# Patient Record
Sex: Male | Born: 1985 | Race: White | Hispanic: No | Marital: Married | Smoking: Former smoker
Health system: Southern US, Community
[De-identification: ages and names within clinical notes are randomized; demographics above are authoritative.]

## PROBLEM LIST (undated history)

## (undated) DIAGNOSIS — R319 Hematuria, unspecified: Secondary | ICD-10-CM

## (undated) HISTORY — PX: TONSILLECTOMY: SUR1361

## (undated) HISTORY — PX: WISDOM TOOTH EXTRACTION: SHX21

## (undated) HISTORY — DX: Hematuria, unspecified: R31.9

---

## 2008-08-02 ENCOUNTER — Ambulatory Visit: Payer: Self-pay | Admitting: Occupational Medicine

## 2011-06-22 ENCOUNTER — Encounter: Payer: Self-pay | Admitting: Emergency Medicine

## 2011-06-22 ENCOUNTER — Emergency Department (INDEPENDENT_AMBULATORY_CARE_PROVIDER_SITE_OTHER)
Admission: EM | Admit: 2011-06-22 | Discharge: 2011-06-22 | Disposition: A | Discharge status: Home / Self Care | Payer: BC Managed Care – PPO | Source: Home / Self Care | Attending: Family Medicine | Admitting: Family Medicine

## 2011-06-22 ENCOUNTER — Other Ambulatory Visit: Payer: Self-pay | Admitting: Family Medicine

## 2011-06-22 DIAGNOSIS — R300 Dysuria: Secondary | ICD-10-CM

## 2011-06-22 DIAGNOSIS — R319 Hematuria, unspecified: Secondary | ICD-10-CM

## 2011-06-22 LAB — POCT URINALYSIS DIPSTICK
Bilirubin, UA: NEGATIVE
Glucose, UA: NEGATIVE
Spec Grav, UA: 1.015 (ref 1.005–1.03)
Urobilinogen, UA: 0.2 (ref 0–1)

## 2011-06-22 MED ORDER — DOXYCYCLINE HYCLATE 100 MG PO TABS: 100.0000 mg | ORAL_TABLET | Freq: Two times a day (BID) | ORAL | Status: AC

## 2011-06-22 NOTE — ED Notes (Signed)
Dysuria x 24 hours.

## 2011-06-24 NOTE — ED Provider Notes (Signed)
History     CSN: [id number] Arrival date & time: 06/22/2011  1:40 PM   First MD Initiated Contact with Patient 06/22/11 1420      Chief Complaint  Patient presents with  . Dysuria     HPI Comments: Patient complains of mild dysuria, frequency, and hesitancy for one day.  He has also noticed intermittent blood in urine.  No urethra discharge.  No testicular pain or swelling.  He feels well otherwise.  Wife is assymptomatic at present.  Patient is a 25 y.o. male presenting with dysuria. The history is provided by the patient.  Dysuria  This is a new problem. The current episode started yesterday. The problem occurs every urination. The problem has not changed since onset.The quality of the pain is described as burning. The pain is mild. There has been no fever. He is sexually active. Associated symptoms include hematuria. He has tried nothing for the symptoms.    History reviewed. No pertinent past medical history.  Past Surgical History  Procedure Date  . Tonsillectomy     History reviewed. No pertinent family history.  History  Substance Use Topics  . Smoking status: Never Smoker   . Smokeless tobacco: Not on file  . Alcohol Use: Yes      Review of Systems  Genitourinary: Positive for dysuria and hematuria.  All other systems reviewed and are negative.    Allergies  Review of patient's allergies indicates no known allergies.  Home Medications   Current Outpatient Rx  Name Route Sig Dispense Refill  . DOXYCYCLINE HYCLATE 100 MG PO TABS Oral Take 1 tablet (100 mg total) by mouth 2 (two) times daily. 14 tablet 0    BP 133/86  Pulse 76  Temp(Src) 98.4 F (36.9 C) (Oral)  Resp 16  Ht 5\' 10"  (1.778 m)  Wt 230 lb (104.327 kg)  BMI 33.00 kg/m2  SpO2 99%  Physical Exam  Nursing note and vitals reviewed. Constitutional: He is oriented to person, place, and time. He appears well-developed and well-nourished. No distress.       Patient is obese (BMI 33.1)     HENT:  Head: Normocephalic.  Mouth/Throat: Oropharynx is clear and moist.  Eyes: Pupils are equal, round, and reactive to light.  Neck: Neck supple.  Cardiovascular: Regular rhythm and normal heart sounds.   Pulmonary/Chest: Effort normal and breath sounds normal.  Abdominal: Soft. There is no tenderness.  Genitourinary: Testes normal and penis normal. Right testis shows no swelling and no tenderness. Left testis shows no swelling and no tenderness. No penile erythema or penile tenderness. No discharge found.  Lymphadenopathy:    He has no cervical adenopathy.       Right: No inguinal adenopathy present.       Left: No inguinal adenopathy present.  Neurological: He is alert and oriented to person, place, and time.  Skin: Skin is warm and dry. No rash noted.  Psychiatric: He has a normal mood and affect.    ED Course  Procedures  none   Labs Reviewed  POCT URINALYSIS DIPSTICK trace blood, otherwise negative  URINE CULTURE pending      1. Dysuria   2. Hematuria       MDM   Urine culture, and GC/chlamydia pending. Empirically begin doxycycline for one week.  Increase fluid intake. Followup with urologist if not improving.        Donna Christen, MD 06/24/11 [phone]

## 2011-06-25 ENCOUNTER — Telehealth: Payer: Self-pay | Admitting: *Deleted

## 2011-06-26 LAB — URINE CULTURE
Colony Count: NO GROWTH
Organism ID, Bacteria: NO GROWTH

## 2011-06-27 LAB — GC/CHLAMYDIA PROBE AMP, URINE
Chlamydia, Swab/Urine, PCR: NEGATIVE
Chlamydia, Swab/Urine, PCR: NEGATIVE

## 2011-06-27 NOTE — ED Provider Notes (Signed)
Contacted patient and notified of GC/chlamydia and urine culture results.  He states that hematuria has resolved.  Still has some very mild intermittent dysuria.   Recommend follow-up by urologist if symptoms persist.  Donna Christen, MD 06/27/11 1600

## 2011-06-28 ENCOUNTER — Telehealth: Payer: Self-pay | Admitting: Emergency Medicine

## 2013-04-30 ENCOUNTER — Ambulatory Visit (INDEPENDENT_AMBULATORY_CARE_PROVIDER_SITE_OTHER): Payer: BC Managed Care – PPO | Admitting: Family Medicine

## 2013-04-30 ENCOUNTER — Ambulatory Visit (INDEPENDENT_AMBULATORY_CARE_PROVIDER_SITE_OTHER): Payer: Managed Care, Other (non HMO)

## 2013-04-30 ENCOUNTER — Encounter: Payer: Self-pay | Admitting: Family Medicine

## 2013-04-30 VITALS — BP 137/91 | HR 77 | Ht 71.0 in | Wt 237.0 lb

## 2013-04-30 DIAGNOSIS — N5089 Other specified disorders of the male genital organs: Secondary | ICD-10-CM

## 2013-04-30 DIAGNOSIS — N508 Other specified disorders of male genital organs: Secondary | ICD-10-CM

## 2013-04-30 LAB — POCT URINALYSIS DIPSTICK
Bilirubin, UA: NEGATIVE
Blood, UA: NEGATIVE
Glucose, UA: NEGATIVE
Spec Grav, UA: 1.025
Urobilinogen, UA: 1
pH, UA: 7

## 2013-04-30 NOTE — Progress Notes (Signed)
CC: Paul Bowen is a 27 y.o. male is here for Establish Care and lump on rt testicle   Subjective: HPI:  Very pleasant 27 year old here to establish care  Patient claims a right testicular mass that he noticed approximately 2-3 weeks ago which is mildly painful to touch. Does not seem to be growing or changing since onset. He's never had this before. No family history of testicular or any other cancer that he knows of. He denies any recent dysuria, testicular swelling, hematuria, discharge, penile pain, unintentional weight loss, nor joint or bone pain. No interventions as of yet. Denies recent trauma to the genitals.  Review of Systems - General ROS: negative for - chills, fever, night sweats, weight gain or weight loss Ophthalmic ROS: negative for - decreased vision Psychological ROS: negative for - anxiety or depression ENT ROS: negative for - hearing change, nasal congestion, tinnitus or allergies Hematological and Lymphatic ROS: negative for - bleeding problems, bruising or swollen lymph nodes Breast ROS: negative Respiratory ROS: no cough, shortness of breath, or wheezing Cardiovascular ROS: no chest pain or dyspnea on exertion Gastrointestinal ROS: no abdominal pain, change in bowel habits, or black or bloody stools Genito-Urinary ROS: negative for - genital discharge, genital ulcers, incontinence or abnormal bleeding from genitals Musculoskeletal ROS: negative for - joint pain or muscle pain Neurological ROS: negative for - headaches or memory loss Dermatological ROS: negative for lumps, mole changes, rash and skin lesion changes  Past Medical History  Diagnosis Date  . Hematuria      Family History  Problem Relation Age of Onset  . Hypertension Mother      History  Substance Use Topics  . Smoking status: Former Smoker    Quit date: 05/01/2007  . Smokeless tobacco: Not on file  . Alcohol Use: Yes     Objective: Filed Vitals:   04/30/13 0822  BP: 137/91  Pulse: 77     General: Alert and Oriented, No Acute Distress HEENT: Pupils equal, round, reactive to light. Conjunctivae clear.  Moist mucous membranes Lungs: Clear to auscultation bilaterally, no wheezing/ronchi/rales.  Comfortable work of breathing. Good air movement. Cardiac: Regular rate and rhythm. Normal S1/S2.  No murmurs, rubs, nor gallops.   Extremities: No peripheral edema.  Strong peripheral pulses.  Genitourinary: Grossly normal-appearing penis without lesions, left testicular unremarkable, right testicle with approximately half centimeter nodule near the epididymis slightly tender to the touch gross to the scrotum does not look swollen or discolored. Both testicles are not high riding or horizontal. Mental Status: No depression, anxiety, nor agitation. Skin: Warm and dry.  Assessment & Plan: [name] was seen today for establish care and lump on rt testicle.  Diagnoses and associated orders for this visit:  Testicular mass - US Scrotum; Future - Urinalysis Dipstick    Testicular mass: Scheduling patient for ultrasound of the scrotum this afternoon urinalysis was obtained due to history of hematuria 2 years ago fortunately unremarkable today. Followup will be determined based on above results.  Return if symptoms worsen or fail to improve.

## 2013-05-03 ENCOUNTER — Encounter: Payer: Self-pay | Admitting: Family Medicine

## 2013-05-03 DIAGNOSIS — N503 Cyst of epididymis: Secondary | ICD-10-CM

## 2013-05-03 HISTORY — DX: Cyst of epididymis: N50.3

## 2014-10-07 IMAGING — US US SCROTUM
1 series · 14 of 25 positions shown · non-contrast
Comparison: None.

CLINICAL DATA: Right testicular mass.

EXAM:
ULTRASOUND OF SCROTUM
TECHNIQUE: Complete ultrasound examination of the testicles, epididymis, and
other scrotal structures was performed.

[Series 1: us scrotum · 0.08mm/px · 14 of 54 slices shown]
[im 1/54]
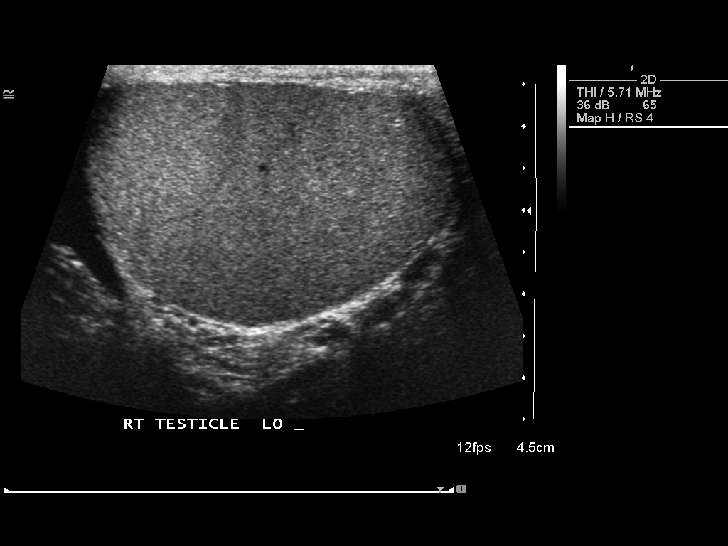
[im 5/54]
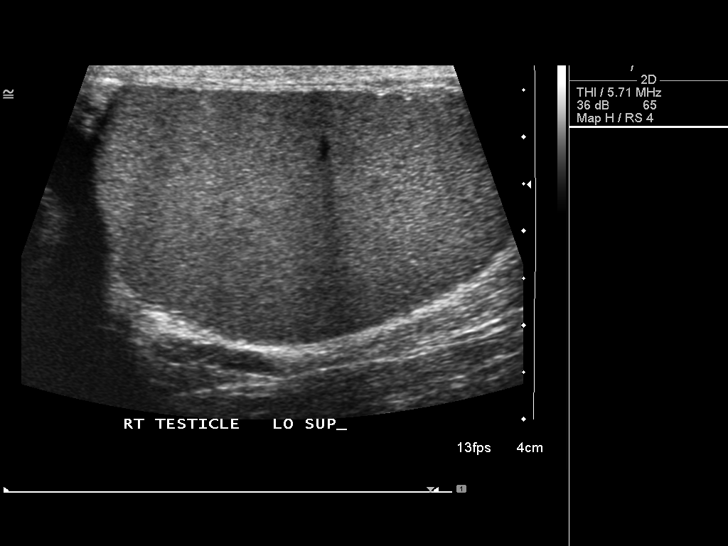
[im 9/54]
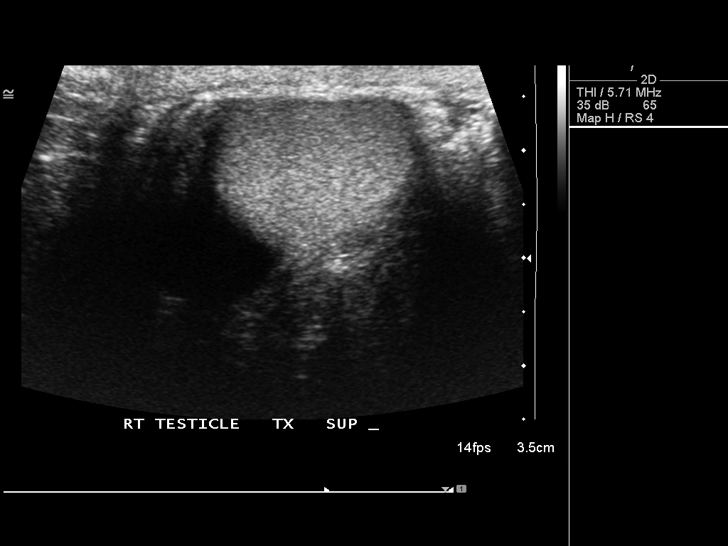
[im 14/54]
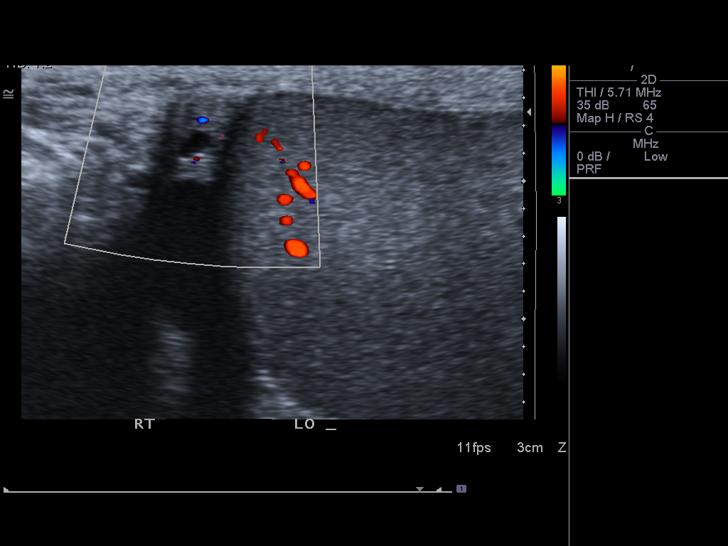
[im 18/54]
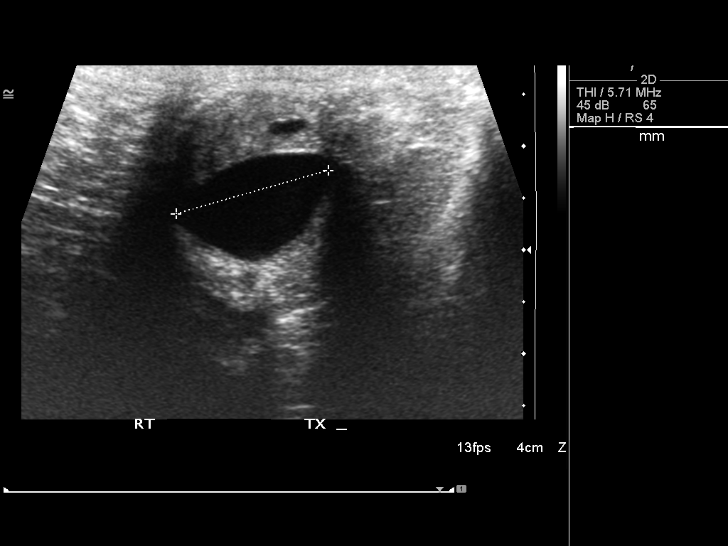
[im 20/54]
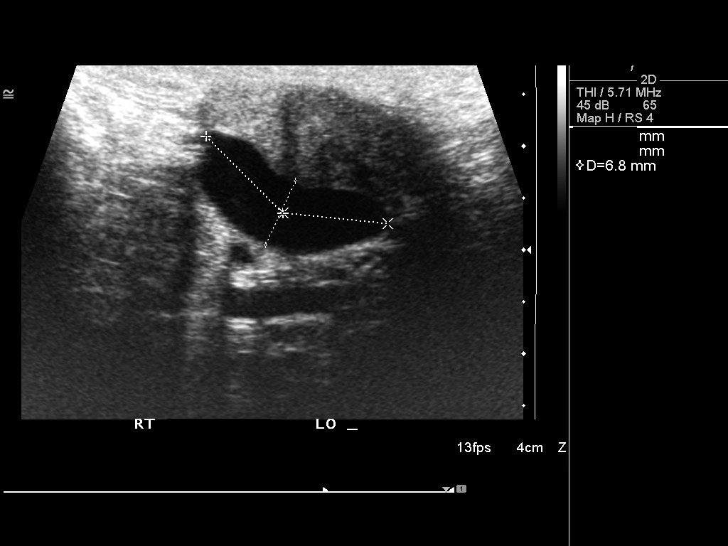
[im 25/54]
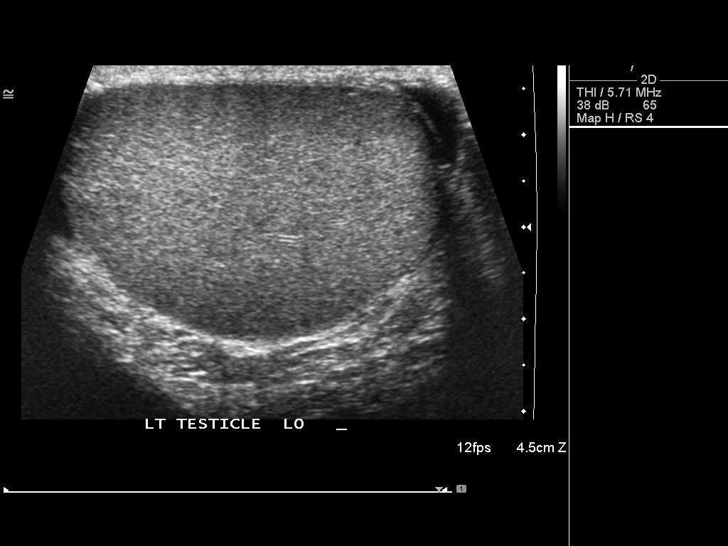
[im 29/54]
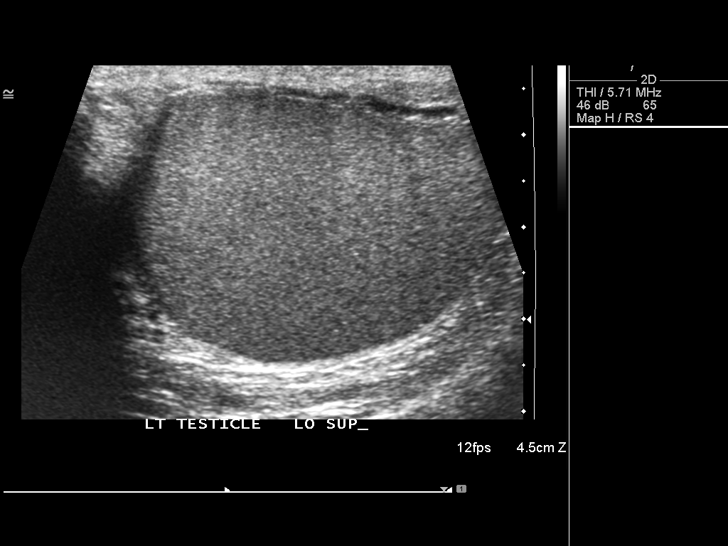
[im 34/54]
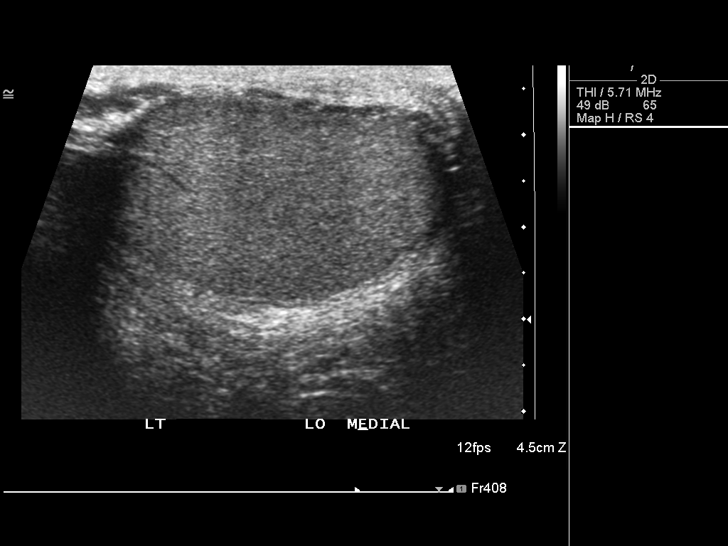
[im 36/54]
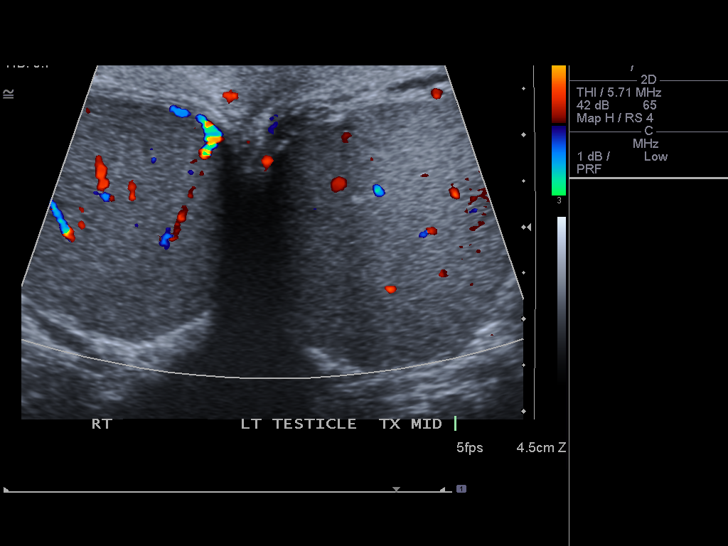
[im 40/54]
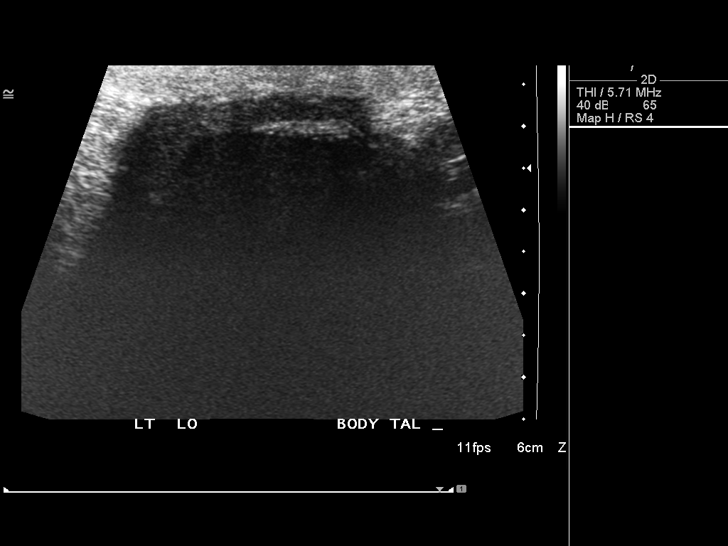
[im 45/54]
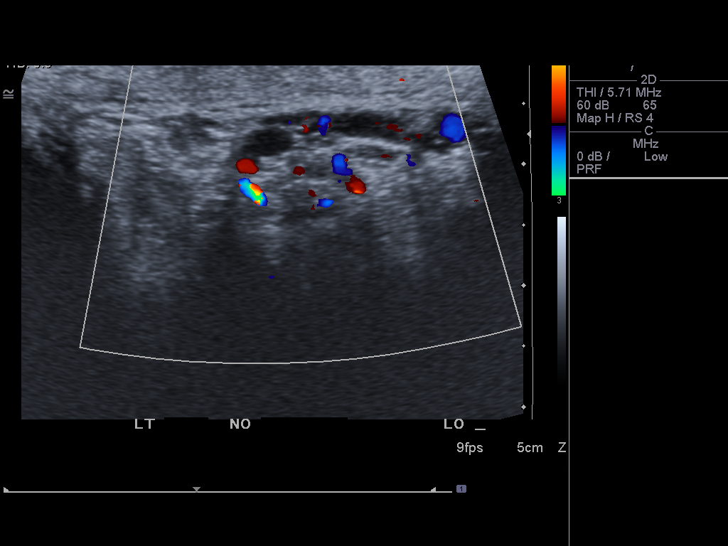
[im 49/54]
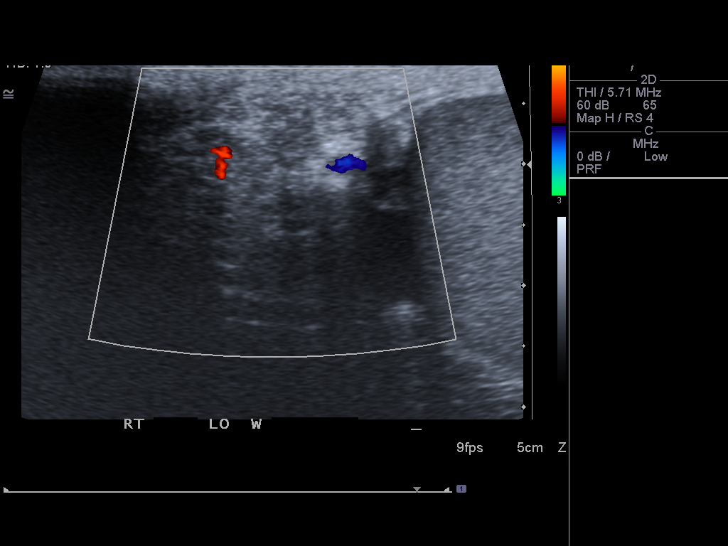
[im 54/54]
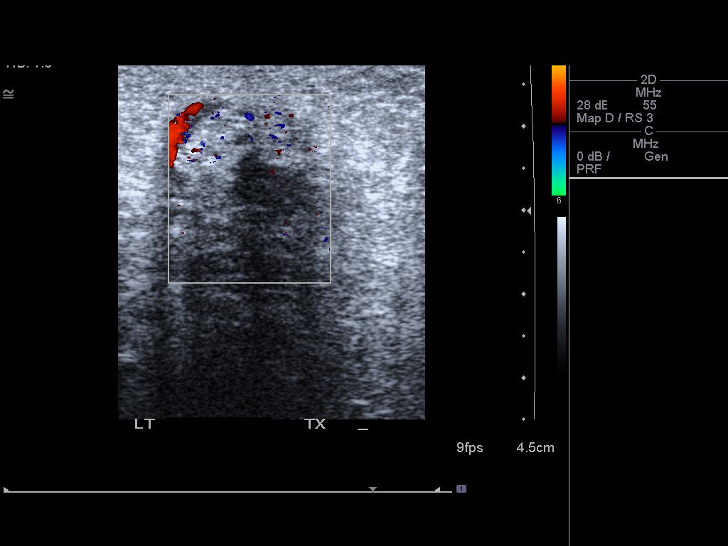

[14 of 25 positions shown; findings below may reference images not displayed]

FINDINGS: Right testicle

Measurements: 4.5 x 2.9 x 3.3 cm. No mass or microlithiasis
visualized.

Left testicle

Measurements: 4.2 x 2.3 x 3.0 cm. No mass or microlithiasis
visualized.

Right epididymis: There is a 2.1 x 0.7 x 1.5 cm right epididymal
cyst. There is a 2nd much smaller cyst in the epididymis.

Left epididymis:  Normal.

Hydrocele:  None

Varicocele:  None
IMPRESSION: There is a benign appearing 2.1 cm epididymal cyst on the right
which is felt to represent the palpable mass. The testicles are
normal. There is perfusion to both testicles.

## 2015-01-31 ENCOUNTER — Encounter: Payer: Self-pay | Admitting: *Deleted

## 2015-01-31 ENCOUNTER — Emergency Department (INDEPENDENT_AMBULATORY_CARE_PROVIDER_SITE_OTHER)
Admission: EM | Admit: 2015-01-31 | Discharge: 2015-01-31 | Disposition: A | Discharge status: Home / Self Care | Payer: Managed Care, Other (non HMO) | Source: Home / Self Care | Attending: Family Medicine | Admitting: Family Medicine

## 2015-01-31 DIAGNOSIS — B353 Tinea pedis: Secondary | ICD-10-CM

## 2015-01-31 MED ORDER — TERBINAFINE HCL 250 MG PO TABS: 250.0000 mg | ORAL_TABLET | Freq: Every day | ORAL | Status: DC

## 2015-01-31 MED ORDER — AMMONIUM LACTATE 12 % EX CREA: TOPICAL_CREAM | CUTANEOUS | Status: DC

## 2015-01-31 NOTE — ED Provider Notes (Signed)
CSN: [id number]     Arrival date & time 01/31/15  1322 History   First MD Initiated Contact with Patient 01/31/15 1322     Chief Complaint  Patient presents with  . Foot Pain      HPI Comments: Patient complains of several month history of scaly cracked areas on the plantar surfaces of both feet.  He has used OTC anti-fungal creams without success.  Patient is a 29 y.o. male presenting with rash. The history is provided by the patient.  Rash Location: both feet. Quality: dryness, itchiness, peeling and scaling   Quality: not painful and not weeping   Severity:  Moderate Onset quality:  Gradual Duration:  4 months Timing:  Constant Progression:  Worsening Chronicity:  Chronic Relieved by:  Nothing Worsened by:  Moisture Ineffective treatments:  Anti-fungal cream Associated symptoms: no induration     Past Medical History  Diagnosis Date  . Hematuria    Past Surgical History  Procedure Laterality Date  . Tonsillectomy    . Wisdom tooth extraction     Family History  Problem Relation Age of Onset  . Hypertension Mother    History  Substance Use Topics  . Smoking status: Former Smoker    Quit date: 05/01/2007  . Smokeless tobacco: Not on file  . Alcohol Use: Yes    Review of Systems  Skin: Positive for rash.  All other systems reviewed and are negative.   Allergies  Review of patient's allergies indicates no known allergies.  Home Medications   Prior to Admission medications   Not on File   BP 136/88 mmHg  Pulse 91  Temp(Src) 98.4 F (36.9 C) (Oral)  Resp 16  Ht 5\' 10"  (1.778 m)  Wt 240 lb (108.863 kg)  BMI 34.44 kg/m2  SpO2 98% Physical Exam Nursing notes and Vital Signs reviewed. Appearance:  Patient appears stated age, and in no acute distress.  Patient is obese (BMI 34.4) Eyes:  Pupils are equal, round, and reactive to light and accomodation.  Extraocular movement is intact.  Conjunctivae are not inflamed  Pharynx:  Normal Neck:  Supple.  No  adenopathy Lungs:  Clear to auscultation.  Breath sounds are equal.  Moving air well. Heart:  Regular rate and rhythm without murmurs, rubs, or gallops.  Abdomen:  Nontender without masses or hepatosplenomegaly.  Bowel sounds are present.  No CVA or flank tenderness.  Extremities:  No edema.   Skin:  The plantar surfaces of both feet have a central scaly hyperkeratotic lesion about 3cm diameter. Plantar surfaces of both feet are slightly erythematous with areas of flaking.  ED Course  Procedures      Labs Reviewed  CULTURE, FUNGUS WITHOUT SMEAR  POCT KOH prep of central lesions:  Fungal elements present       MDM   1. Tinea pedis of both feet   Begin Terbinafine 250mg  daily for two weeks. Begin applying Lac-Hydrin cream BID. Fungal culture pending.    Lattie HawStephen A Onetha Gaffey, MD 02/05/15 [phone]

## 2015-01-31 NOTE — ED Notes (Signed)
Pt c/o bilaterally pain and cracking skin on his feet x several months. He has used multiple OTC meds with no relief.

## 2015-01-31 NOTE — Discharge Instructions (Signed)

## 2015-02-05 ENCOUNTER — Telehealth: Payer: Self-pay | Admitting: Emergency Medicine

## 2015-02-28 LAB — CULTURE, FUNGUS WITHOUT SMEAR

## 2015-03-02 ENCOUNTER — Telehealth: Payer: Self-pay | Admitting: *Deleted

## 2015-04-17 ENCOUNTER — Encounter: Payer: Self-pay | Admitting: Family Medicine

## 2015-04-17 ENCOUNTER — Ambulatory Visit (INDEPENDENT_AMBULATORY_CARE_PROVIDER_SITE_OTHER): Payer: Managed Care, Other (non HMO) | Admitting: Family Medicine

## 2015-04-17 VITALS — BP 137/84 | HR 85 | Ht 71.0 in | Wt 238.0 lb

## 2015-04-17 DIAGNOSIS — Z0000 Encounter for general adult medical examination without abnormal findings: Secondary | ICD-10-CM | POA: Diagnosis not present

## 2015-04-17 DIAGNOSIS — N200 Calculus of kidney: Secondary | ICD-10-CM

## 2015-04-17 DIAGNOSIS — B353 Tinea pedis: Secondary | ICD-10-CM | POA: Diagnosis not present

## 2015-04-17 HISTORY — DX: Calculus of kidney: N20.0

## 2015-04-17 LAB — COMPLETE METABOLIC PANEL WITH GFR
ALT: 36 U/L (ref 9–46)
AST: 22 U/L (ref 10–40)
Albumin: 4.6 g/dL (ref 3.6–5.1)
Alkaline Phosphatase: 43 U/L (ref 40–115)
BILIRUBIN TOTAL: 0.8 mg/dL (ref 0.2–1.2)
BUN: 14 mg/dL (ref 7–25)
CO2: 26 mmol/L (ref 20–31)
Calcium: 9.8 mg/dL (ref 8.6–10.3)
Chloride: 104 mmol/L (ref 98–110)
Creat: 0.79 mg/dL (ref 0.60–1.35)
GFR, Est African American: >89 mL/min (ref >=60)
GLUCOSE: 83 mg/dL (ref 65–99)
POTASSIUM: 4.2 mmol/L (ref 3.5–5.3)
SODIUM: 139 mmol/L (ref 135–146)
TOTAL PROTEIN: 7 g/dL (ref 6.1–8.1)

## 2015-04-17 LAB — CBC
HCT: 45.8 % (ref 39.0–52.0)
HEMOGLOBIN: 15.6 g/dL (ref 13.0–17.0)
MCH: 29.4 pg (ref 26.0–34.0)
MCHC: 34.1 g/dL (ref 30.0–36.0)
MCV: 86.4 fL (ref 78.0–100.0)
MPV: 10 fL (ref 8.6–12.4)
Platelets: 221 10*3/uL (ref 150–400)
RBC: 5.3 MIL/uL (ref 4.22–5.81)
RDW: 13.1 % (ref 11.5–15.5)
WBC: 7.9 10*3/uL (ref 4.0–10.5)

## 2015-04-17 LAB — LIPID PANEL
Cholesterol: 150 mg/dL (ref 125–200)
HDL: 35 mg/dL — AB (ref >=40)
LDL CALC: 88 mg/dL (ref <130)
Total CHOL/HDL Ratio: 4.3 Ratio (ref <=5.0)
Triglycerides: 134 mg/dL (ref <150)
VLDL: 27 mg/dL (ref <30)

## 2015-04-17 LAB — TSH: TSH: 1.206 u[IU]/mL (ref 0.350–4.500)

## 2015-04-17 MED ORDER — TERBINAFINE HCL 250 MG PO TABS: ORAL_TABLET | ORAL | Status: DC

## 2015-04-17 NOTE — Patient Instructions (Signed)
Dr. Lakyra Tippins's General Advice Following Your Complete Physical Exam  The Benefits of Regular Exercise: Unless you suffer from an uncontrolled cardiovascular condition, studies strongly suggest that regular exercise and physical activity will add to both the quality and length of your life.  The World Health Organization recommends 150 minutes of moderate intensity aerobic activity every week.  This is best split over 3-4 days a week, and can be as simple as a brisk walk for just over 35 minutes "most days of the week".  This type of exercise has been shown to lower LDL-Cholesterol, lower average blood sugars, lower blood pressure, lower cardiovascular disease risk, improve memory, and increase one's overall sense of wellbeing.  The addition of anaerobic (or "strength training") exercises offers additional benefits including but not limited to increased metabolism, prevention of osteoporosis, and improved overall cholesterol levels.  How Can I Strive For A Low-Fat Diet?: Current guidelines recommend that 25-35 percent of your daily energy (food) intake should come from fats.  One might ask how can this be achieved without having to dissect each meal on a daily basis?  Switch to skim or 1% milk instead of whole milk.  Focus on lean meats such as ground turkey, fresh fish, baked chicken, and lean cuts of beef as your source of dietary protein.  Limit saturated fat consumption to less than 10% of your daily caloric intake.  Limit trans fatty acid consumption primarily by limiting synthetic trans fats such as partially hydrogenated oils (Ex: fried fast foods).  Substitute olive or vegetable oil for solid fats where possible.  Moderation of Salt Intake: Provided you don't carry a diagnosis of congestive heart failure nor renal failure, I recommend a daily allowance of no more than 2300 mg of salt (sodium).  Keeping under this daily goal is associated with a decreased risk of cardiovascular events, creeping  above it can lead to elevated blood pressures and increases your risk of cardiovascular events.  Milligrams (mg) of salt is listed on all nutrition labels, and your daily intake can add up faster than you think.  Most canned and frozen dinners can pack in over half your daily salt allowance in one meal.    Lifestyle Health Risks: Certain lifestyle choices carry specific health risks.  As you may already know, tobacco use has been associated with increasing one's risk of cardiovascular disease, pulmonary disease, numerous cancers, among many other issues.  What you may not know is that there are medications and nicotine replacement strategies that can more than double your chances of successfully quitting.  I would be thrilled to help manage your quitting strategy if you currently use tobacco products.  When it comes to alcohol use, I've yet to find an "ideal" daily allowance.  Provided an individual does not have a medical condition that is exacerbated by alcohol consumption, general guidelines determine "safe drinking" as no more than two standard drinks for a man or no more than one standard drink for a male per day.  However, much debate still exists on whether any amount of alcohol consumption is technically "safe".  My general advice, keep alcohol consumption to a minimum for general health promotion.  If you or others believe that alcohol, tobacco, or recreational drug use is interfering with your life, I would be happy to provide confidential counseling regarding treatment options.  General "Over The Counter" Nutrition Advice: Postmenopausal women should aim for a daily calcium intake of 1200 mg, however a significant portion of this might already be   provided by diets including milk, yogurt, cheese, and other dairy products.  Vitamin D has been shown to help preserve bone density, prevent fatigue, and has even been shown to help reduce falls in the elderly.  Ensuring a daily intake of 800 Units of  Vitamin D is a good place to start to enjoy the above benefits, we can easily check your Vitamin D level to see if you'd potentially benefit from supplementation beyond 800 Units a day.  Folic Acid intake should be of particular concern to women of childbearing age.  Daily consumption of 400-800 mcg of Folic Acid is recommended to minimize the chance of spinal cord defects in a fetus should pregnancy occur.    For many adults, accidents still remain one of the most common culprits when it comes to cause of death.  Some of the simplest but most effective preventitive habits you can adopt include regular seatbelt use, proper helmet use, securing firearms, and regularly testing your smoke and carbon monoxide detectors.  Bach Rocchi B. Zaelynn Fuchs DO [facility] [address], [address] [place], [place] [address] Phone: [phone]  

## 2015-04-17 NOTE — Progress Notes (Signed)
CC: Paul Bowen is a 29 y.o. male is here for Annual Exam   Subjective: HPI:  Colonoscopy: No family history of colon cancer will consider screening at age 65 Prostate: Discussed screening risks/beneifts with patient today, no family history of prostate cancer will consider screening at age 89   Influenza Vaccine: Up-to-date Pneumovax: No current indication Td/Tdap: Up-to-date Zoster: (Start 31 yo)  Patient updates me that since I saw him last he had a right-sided kidney stone that passed without any intervention other than increasing fluids. He's also had a bad case of athlete's foot bilaterally but got significantly better while taking terbinafine for 2 weeks however symptoms slowly returned. He describes itchy, scaly and flaky skin on the bottom of the feet.  Review of Systems - General ROS: negative for - chills, fever, night sweats, weight gain or weight loss Ophthalmic ROS: negative for - decreased vision Psychological ROS: negative for - anxiety or depression ENT ROS: negative for - hearing change, nasal congestion, tinnitus or allergies Hematological and Lymphatic ROS: negative for - bleeding problems, bruising or swollen lymph nodes Breast ROS: negative Respiratory ROS: no cough, shortness of breath, or wheezing Cardiovascular ROS: no chest pain or dyspnea on exertion Gastrointestinal ROS: no abdominal pain, change in bowel habits, or black or bloody stools Genito-Urinary ROS: negative for - genital discharge, genital ulcers, incontinence or abnormal bleeding from genitals Musculoskeletal ROS: negative for - joint pain or muscle pain Neurological ROS: negative for - headaches or memory loss Dermatological ROS: negative for lumps, mole changes, rash and skin lesion changes other than that described above  Past Medical History  Diagnosis Date  . Hematuria     Past Surgical History  Procedure Laterality Date  . Tonsillectomy    . Wisdom tooth extraction     Family  History  Problem Relation Age of Onset  . Hypertension Mother     Social History   Social History  . Marital Status: Single    Spouse Name: N/A  . Number of Children: N/A  . Years of Education: N/A   Occupational History  . Not on file.   Social History Main Topics  . Smoking status: Former Smoker    Quit date: 05/01/2007  . Smokeless tobacco: Not on file  . Alcohol Use: Yes  . Drug Use: No  . Sexual Activity: Yes   Other Topics Concern  . Not on file   Social History Narrative     Objective: BP 137/84 mmHg  Pulse 85  Ht  (1.803 m)  Wt 238 lb (107.956 kg)  BMI 33.21 kg/m2  General: No Acute Distress HEENT: Atraumatic, normocephalic, conjunctivae normal without scleral icterus.  No nasal discharge, hearing grossly intact, TMs with good landmarks bilaterally with no middle ear abnormalities, posterior pharynx clear without oral lesions. Neck: Supple, trachea midline, no cervical nor supraclavicular adenopathy. Pulmonary: Clear to auscultation bilaterally without wheezing, rhonchi, nor rales. Cardiac: Regular rate and rhythm.  No murmurs, rubs, nor gallops. No peripheral edema.  2+ peripheral pulses bilaterally. Abdomen: Bowel sounds normal.  No masses.  Non-tender without rebound.  Negative Murphy's sign. MSK: Grossly intact, no signs of weakness.  Full strength throughout upper and lower extremities.  Full ROM in upper and lower extremities.  No midline spinal tenderness. Neuro: Gait unremarkable, CN II-XII grossly intact.  C5-C6 Reflex 2/4 Bilaterally, L4 Reflex 2/4 Bilaterally.  Cerebellar function intact. Skin: Plantar tinea pedis bilaterally Psych: Alert and oriented to person/place/time.  Thought process normal. No anxiety/depression.  Assessment & Plan: [name] was seen today for annual exam.  Diagnoses and all orders for this visit:  Annual physical exam -     Lipid panel -     COMPLETE METABOLIC PANEL WITH GFR -     CBC -      TSH  Nephrolithiasis  Tinea pedis of both feet -     terbinafine (LAMISIL) 250 MG tablet; One by mouth daily for up to two months, may stop one week after symptoms disappear.  Healthy lifestyle interventions including but not limited to regular exercise, a healthy low fat diet, moderation of salt intake, the dangers of tobacco/alcohol/recreational drug use, nutrition supplementation, and accident avoidance were discussed with the patient and a handout was provided for future reference.  Return in about 1 year (around 04/16/2016) for Annual Physical..

## 2015-07-28 ENCOUNTER — Encounter: Payer: Self-pay | Admitting: Family Medicine

## 2015-08-16 ENCOUNTER — Encounter: Payer: Self-pay | Admitting: Family Medicine

## 2015-08-16 ENCOUNTER — Ambulatory Visit (INDEPENDENT_AMBULATORY_CARE_PROVIDER_SITE_OTHER): Payer: Managed Care, Other (non HMO)

## 2015-08-16 ENCOUNTER — Ambulatory Visit (INDEPENDENT_AMBULATORY_CARE_PROVIDER_SITE_OTHER): Payer: Managed Care, Other (non HMO) | Admitting: Family Medicine

## 2015-08-16 VITALS — BP 136/87 | HR 100 | Wt 245.0 lb

## 2015-08-16 DIAGNOSIS — J189 Pneumonia, unspecified organism: Secondary | ICD-10-CM

## 2015-08-16 DIAGNOSIS — R5383 Other fatigue: Secondary | ICD-10-CM | POA: Diagnosis not present

## 2015-08-16 DIAGNOSIS — N200 Calculus of kidney: Secondary | ICD-10-CM

## 2015-08-16 HISTORY — DX: Other fatigue: R53.83

## 2015-08-16 LAB — POCT URINALYSIS DIPSTICK
BILIRUBIN UA: NEGATIVE
Glucose, UA: NEGATIVE
KETONES UA: NEGATIVE
LEUKOCYTES UA: NEGATIVE
NITRITE UA: NEGATIVE
Spec Grav, UA: 1.02
Urobilinogen, UA: 0.2
pH, UA: 5.5

## 2015-08-16 MED ORDER — HYDROCODONE-ACETAMINOPHEN 10-325 MG PO TABS: 1.0000 | ORAL_TABLET | Freq: Three times a day (TID) | ORAL | Status: DC | PRN

## 2015-08-16 MED ORDER — TAMSULOSIN HCL 0.4 MG PO CAPS: 0.4000 mg | ORAL_CAPSULE | Freq: Every day | ORAL | Status: DC

## 2015-08-16 NOTE — Progress Notes (Signed)
CC: Paul Bowen is a 30 y.o. male is here for Flank Pain and Abdominal Pain   Subjective: HPI:  Ever since Sunday of this week he's been experiencing left flank pain that radiates into the anterior pelvis. It's also joined by urinary frequency and urgency. Interventions have included increased fluids. Nothing else seems to be helping. There is no positional component to his pain. He denies rectal pain or gastric intestinal complaints. He tells me it feels like her episodes of nephrolithiasis. He had some nausea yesterday with chills but no fever or vomiting. He's lost his appetite since this came on. He denies any dysuria or urinary discharge. Denies any back pain.   Review Of Systems Outlined In HPI  Past Medical History  Diagnosis Date  . Hematuria     Past Surgical History  Procedure Laterality Date  . Tonsillectomy    . Wisdom tooth extraction     Family History  Problem Relation Age of Onset  . Hypertension Mother     Social History   Social History  . Marital Status: Single    Spouse Name: N/A  . Number of Children: N/A  . Years of Education: N/A   Occupational History  . Not on file.   Social History Main Topics  . Smoking status: Former Smoker    Quit date: 05/01/2007  . Smokeless tobacco: Not on file  . Alcohol Use: Yes  . Drug Use: No  . Sexual Activity: Yes   Other Topics Concern  . Not on file   Social History Narrative     Objective: BP 136/87 mmHg  Pulse 100  Wt 245 lb (111.131 kg)  Vital signs reviewed. General: Alert and Oriented, No Acute Distress HEENT: Pupils equal, round, reactive to light. Conjunctivae clear.  External ears unremarkable.  Moist mucous membranes. Lungs: Clear and comfortable work of breathing, speaking in full sentences without accessory muscle use. Cardiac: Regular rate and rhythm.  Neuro: CN II-XII grossly intact, gait normal. Extremities: No peripheral edema.  Strong peripheral pulses.  Mental Status: No depression,  anxiety, nor agitation. Logical though process. Skin: Warm and dry.  Assessment & Plan: [name] was seen today for flank pain and abdominal pain.  Diagnoses and all orders for this visit:  Other fatigue  Nephrolithiasis -     tamsulosin (FLOMAX) 0.4 MG CAPS capsule; Take 1 capsule (0.4 mg total) by mouth daily. -     HYDROcodone-acetaminophen (NORCO) 10-325 MG tablet; Take 1 tablet by mouth every 8 (eight) hours as needed. -     US Renal -     POCT urinalysis dipstick   He recently emailed me about checking testosterone and vitamin D, he tells me he was fatigued at that time but this resolved on its own. Blood in urine suspicious for nephrolithiasis therefore start when necessary hydrocodone and daily Flomax pending results from the ultrasound ordered today.   Return if symptoms worsen or fail to improve.

## 2015-08-18 ENCOUNTER — Ambulatory Visit (INDEPENDENT_AMBULATORY_CARE_PROVIDER_SITE_OTHER): Payer: Managed Care, Other (non HMO) | Admitting: Family Medicine

## 2015-08-18 ENCOUNTER — Ambulatory Visit (INDEPENDENT_AMBULATORY_CARE_PROVIDER_SITE_OTHER): Payer: Managed Care, Other (non HMO)

## 2015-08-18 VITALS — BP 153/79 | HR 106 | Temp 98.4°F | Wt 244.0 lb

## 2015-08-18 DIAGNOSIS — N200 Calculus of kidney: Secondary | ICD-10-CM | POA: Diagnosis not present

## 2015-08-18 DIAGNOSIS — J189 Pneumonia, unspecified organism: Secondary | ICD-10-CM

## 2015-08-18 LAB — POCT URINALYSIS DIPSTICK
BILIRUBIN UA: NEGATIVE
Glucose, UA: NEGATIVE
KETONES UA: NEGATIVE
Leukocytes, UA: NEGATIVE
Nitrite, UA: NEGATIVE
PROTEIN UA: NEGATIVE
SPEC GRAV UA: 1.02
Urobilinogen, UA: 1
pH, UA: 5.5

## 2015-08-18 NOTE — Patient Instructions (Signed)
Thank you for coming in today. Get blood work and CT scan today.  Return with Dr Rexene Edison next week.  If your belly pain worsens, or you have high fever, bad vomiting, blood in your stool or black tarry stool go to the Emergency Room.   Kidney Stones Kidney stones (urolithiasis) are deposits that form inside your kidneys. The intense pain is caused by the stone moving through the urinary tract. When the stone moves, the ureter goes into spasm around the stone. The stone is usually passed in the urine.  CAUSES   A disorder that makes certain neck glands produce too much parathyroid hormone (primary hyperparathyroidism).  A buildup of uric acid crystals, similar to gout in your joints.  Narrowing (stricture) of the ureter.  A kidney obstruction present at birth (congenital obstruction).  Previous surgery on the kidney or ureters.  Numerous kidney infections. SYMPTOMS   Feeling sick to your stomach (nauseous).  Throwing up (vomiting).  Blood in the urine (hematuria).  Pain that usually spreads (radiates) to the groin.  Frequency or urgency of urination. DIAGNOSIS   Taking a history and physical exam.  Blood or urine tests.  CT scan.  Occasionally, an examination of the inside of the urinary bladder (cystoscopy) is performed. TREATMENT   Observation.  Increasing your fluid intake.  Extracorporeal shock wave lithotripsy--This is a noninvasive procedure that uses shock waves to break up kidney stones.  Surgery may be needed if you have severe pain or persistent obstruction. There are various surgical procedures. Most of the procedures are performed with the use of small instruments. Only small incisions are needed to accommodate these instruments, so recovery time is minimized. The size, location, and chemical composition are all important variables that will determine the proper choice of action for you. Talk to your health care provider to better understand your situation so  that you will minimize the risk of injury to yourself and your kidney.  HOME CARE INSTRUCTIONS   Drink enough water and fluids to keep your urine clear or pale yellow. This will help you to pass the stone or stone fragments.  Strain all urine through the provided strainer. Keep all particulate matter and stones for your health care provider to see. The stone causing the pain may be as small as a grain of salt. It is very important to use the strainer each and every time you pass your urine. The collection of your stone will allow your health care provider to analyze it and verify that a stone has actually passed. The stone analysis will often identify what you can do to reduce the incidence of recurrences.  Only take over-the-counter or prescription medicines for pain, discomfort, or fever as directed by your health care provider.  Keep all follow-up visits as told by your health care provider. This is important.  Get follow-up X-rays if required. The absence of pain does not always mean that the stone has passed. It may have only stopped moving. If the urine remains completely obstructed, it can cause loss of kidney function or even complete destruction of the kidney. It is your responsibility to make sure X-rays and follow-ups are completed. Ultrasounds of the kidney can show blockages and the status of the kidney. Ultrasounds are not associated with any radiation and can be performed easily in a matter of minutes.  Make changes to your daily diet as told by your health care provider. You may be told to:  Limit the amount of salt that you  eat.  Eat 5 or more servings of fruits and vegetables each day.  Limit the amount of meat, poultry, fish, and eggs that you eat.  Collect a 24-hour urine sample as told by your health care provider.You may need to collect another urine sample every 6-12 months. SEEK MEDICAL CARE IF:  You experience pain that is progressive and unresponsive to any pain  medicine you have been prescribed. SEEK IMMEDIATE MEDICAL CARE IF:   Pain cannot be controlled with the prescribed medicine.  You have a fever or shaking chills.  The severity or intensity of pain increases over 18 hours and is not relieved by pain medicine.  You develop a new onset of abdominal pain.  You feel faint or pass out.  You are unable to urinate.   This information is not intended to replace advice given to you by your health care provider. Make sure you discuss any questions you have with your health care provider.   Document Released: 07/01/2005 Document Revised: 03/22/2015 Document Reviewed: 12/02/2012 Elsevier Interactive Patient Education [employer]

## 2015-08-18 NOTE — Progress Notes (Signed)
       Tashan Kreitzer is a 30 y.o. male who presents to [facility] today for right flank pain. Patient was seen on February 1 for right flank pain thought to be due to kidney stone. He was treated empirically with Norco and Flomax and ibuprofen. He had a renal ultrasound NORMAL. His symptoms persist. He notes continued blood-tinged urine and right-sided flank pain. No fevers chills nausea vomiting or diarrhea. He feels well otherwise.   Past Medical History  Diagnosis Date  . Hematuria    Past Surgical History  Procedure Laterality Date  . Tonsillectomy    . Wisdom tooth extraction     Social History  Substance Use Topics  . Smoking status: Former Smoker    Quit date: 05/01/2007  . Smokeless tobacco: Not on file  . Alcohol Use: Yes   family history includes Hypertension in his mother.  ROS as above Medications: Current Outpatient Prescriptions  Medication Sig Dispense Refill  . HYDROcodone-acetaminophen (NORCO) 10-325 MG tablet Take 1 tablet by mouth every 8 (eight) hours as needed. 30 tablet 0  . tamsulosin (FLOMAX) 0.4 MG CAPS capsule Take 1 capsule (0.4 mg total) by mouth daily. 30 capsule 0   No current facility-administered medications for this visit.   No Known Allergies   Exam:  BP 153/79 mmHg  Pulse 106  Wt 244 lb (110.678 kg) Gen: Well NAD HEENT: EOMI,  MMM Lungs: Normal work of breathing. CTABL Heart: RRR no MRG Abd: NABS, Soft. Nondistended, no palpation right lower quadrant without rebound or guarding. No CVA tenderness to percussion. Exts: Brisk capillary refill, warm and well perfused.  Back is nontender with normal motion.  Results for orders placed or performed in visit on 08/18/15 (from the past 24 hour(s))  POCT urinalysis dipstick     Status: Abnormal   Collection Time: 08/18/15 11:17 AM  Result Value Ref Range   Color, UA amber    Clarity, UA clear     Glucose, UA negative    Bilirubin, UA negative    Ketones, UA negative    Spec Grav, UA 1.020    Blood, UA trace    pH, UA 5.5    Protein, UA negative    Urobilinogen, UA 1.0    Nitrite, UA negative    Leukocytes, UA Negative Negative   US Renal  08/16/2015  CLINICAL DATA:  Bilateral flank pain radiating to the lower abdomen, hematuria, history of kidney stones EXAM: RENAL / URINARY TRACT ULTRASOUND COMPLETE COMPARISON:  None. FINDINGS: Right Kidney: Length: 12.8 cm. No hydronephrosis is seen. No renal calculi are evident by ultrasound. Left Kidney: Length: 13.0 cm. No hydronephrosis is noted and no definite renal calculi are seen. Bladder: The urinary bladder is moderately urine distended. Bilateral ureteral jets are present. IMPRESSION: No hydronephrosis. No renal calculi are noted. Bilateral ureteral jets are visualized. Electronically Signed   By: Dwyane Dee M.D.   On: 08/16/2015 13:38     Please see individual assessment and plan sections.

## 2015-08-18 NOTE — Assessment & Plan Note (Signed)
Still symptomatic. Plan for CT scan of the abdomen and pelvis without contrast. Urine culture pending. CBC CMP pending. Follow-up with PCP.

## 2015-08-19 LAB — COMPREHENSIVE METABOLIC PANEL
ALBUMIN: 4.1 g/dL (ref 3.6–5.1)
ALT: 42 U/L (ref 9–46)
AST: 31 U/L (ref 10–40)
Alkaline Phosphatase: 46 U/L (ref 40–115)
BILIRUBIN TOTAL: 0.8 mg/dL (ref 0.2–1.2)
BUN: 10 mg/dL (ref 7–25)
CALCIUM: 8.8 mg/dL (ref 8.6–10.3)
CHLORIDE: 100 mmol/L (ref 98–110)
CO2: 23 mmol/L (ref 20–31)
Creat: 0.74 mg/dL (ref 0.60–1.35)
Glucose, Bld: 91 mg/dL (ref 65–99)
Potassium: 3.5 mmol/L (ref 3.5–5.3)
Sodium: 136 mmol/L (ref 135–146)
Total Protein: 6.5 g/dL (ref 6.1–8.1)

## 2015-08-19 LAB — CBC
HEMATOCRIT: 40.1 % (ref 39.0–52.0)
HEMOGLOBIN: 13.7 g/dL (ref 13.0–17.0)
MCH: 29.3 pg (ref 26.0–34.0)
MCHC: 34.2 g/dL (ref 30.0–36.0)
MCV: 85.7 fL (ref 78.0–100.0)
MPV: 9.6 fL (ref 8.6–12.4)
Platelets: 197 10*3/uL (ref 150–400)
RBC: 4.68 MIL/uL (ref 4.22–5.81)
RDW: 13 % (ref 11.5–15.5)
WBC: 8.3 10*3/uL (ref 4.0–10.5)

## 2015-08-20 LAB — URINE CULTURE
Colony Count: NO GROWTH
Organism ID, Bacteria: NO GROWTH

## 2015-08-21 ENCOUNTER — Telehealth: Payer: Self-pay | Admitting: Family Medicine

## 2015-08-21 MED ORDER — LEVOFLOXACIN 500 MG PO TABS: 500.0000 mg | ORAL_TABLET | Freq: Every day | ORAL | Status: DC

## 2015-08-21 NOTE — Telephone Encounter (Signed)
I called pt with results of CT scan. Levaquin called in,.

## 2015-08-23 ENCOUNTER — Ambulatory Visit: Payer: Managed Care, Other (non HMO) | Admitting: Family Medicine

## 2015-09-13 ENCOUNTER — Encounter: Payer: Self-pay | Admitting: Family Medicine

## 2015-09-13 DIAGNOSIS — B353 Tinea pedis: Secondary | ICD-10-CM

## 2015-10-07 ENCOUNTER — Other Ambulatory Visit: Payer: Self-pay | Admitting: Family Medicine

## 2015-10-10 ENCOUNTER — Encounter: Payer: Self-pay | Admitting: Family Medicine

## 2015-10-10 DIAGNOSIS — L309 Dermatitis, unspecified: Secondary | ICD-10-CM | POA: Insufficient documentation

## 2015-10-10 HISTORY — DX: Dermatitis, unspecified: L30.9

## 2016-06-18 IMAGING — US US RENAL
1 series · 14 of 25 positions shown · non-contrast
Comparison: None.

CLINICAL DATA: Bilateral flank pain radiating to the lower abdomen,
hematuria, history of kidney stones

EXAM:
RENAL / URINARY TRACT ULTRASOUND COMPLETE

[Series 1: us renal · 0.24mm/px · 14 of 34 slices shown]
[im 1/34]
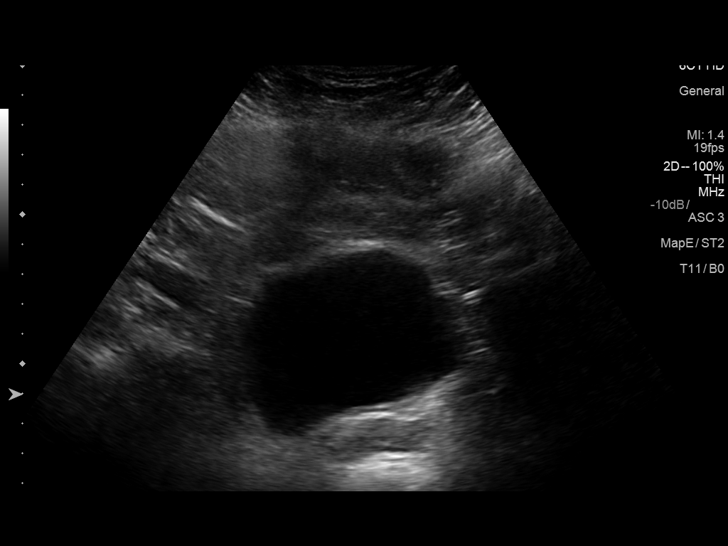
[im 3/34]
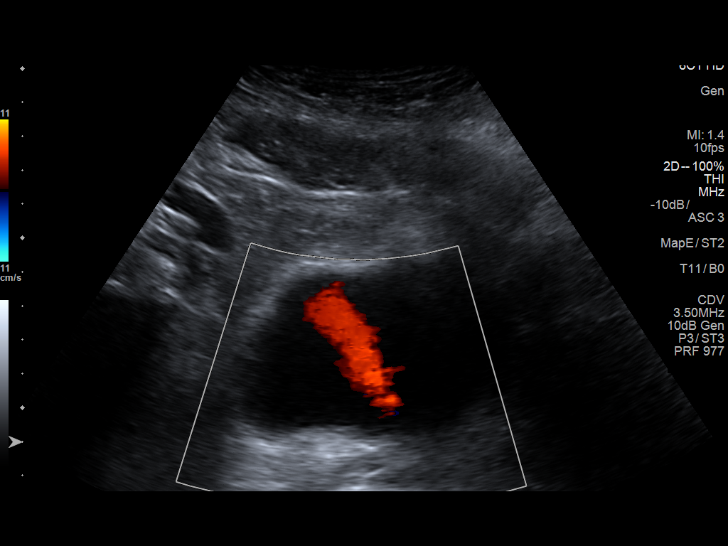
[im 6/34]
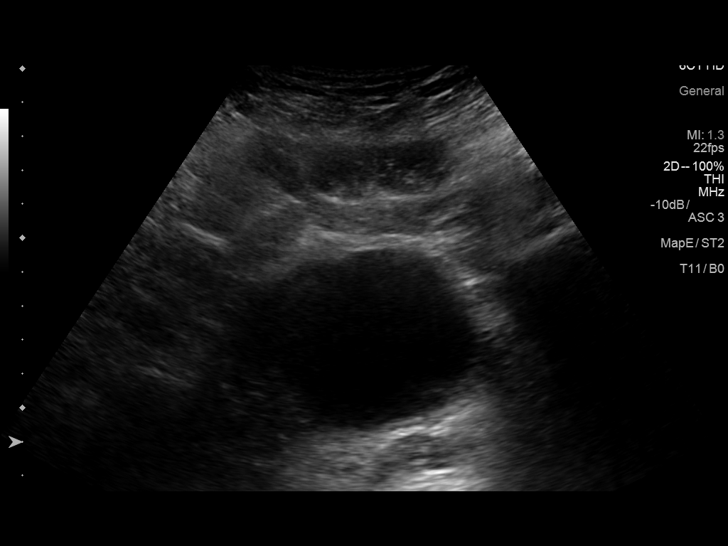
[im 9/34]
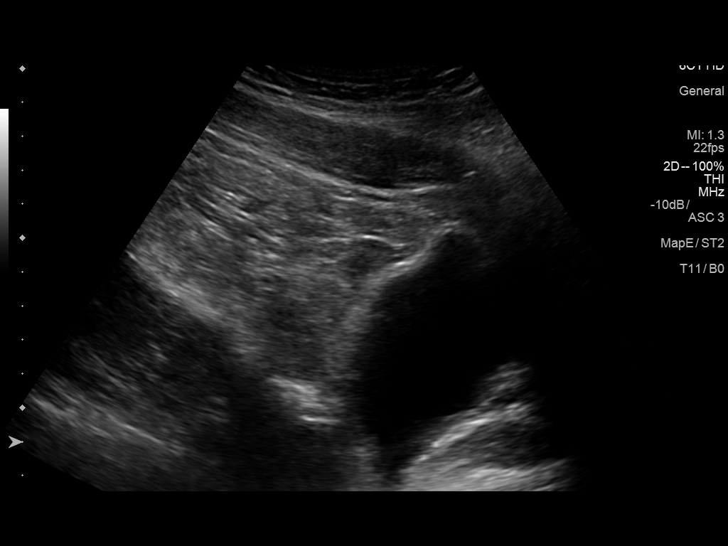
[im 12/34]
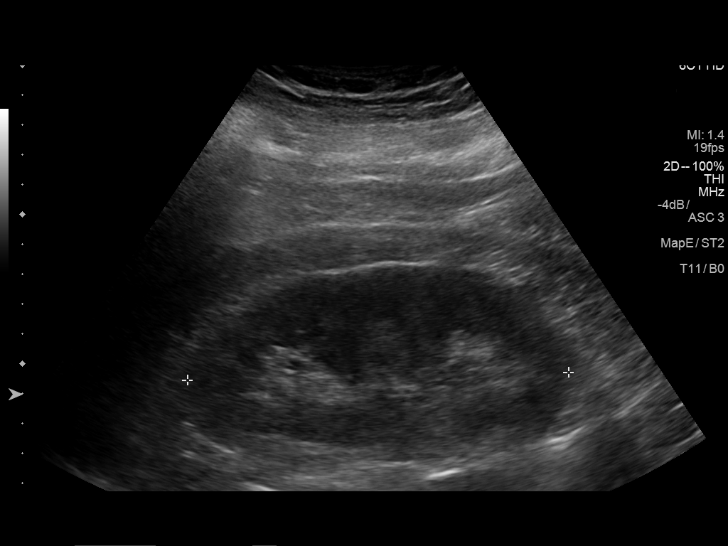
[im 13/34]
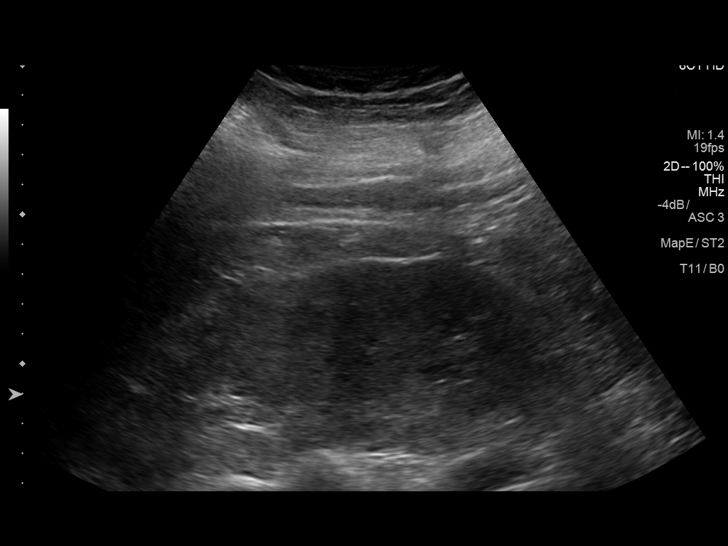
[im 16/34]
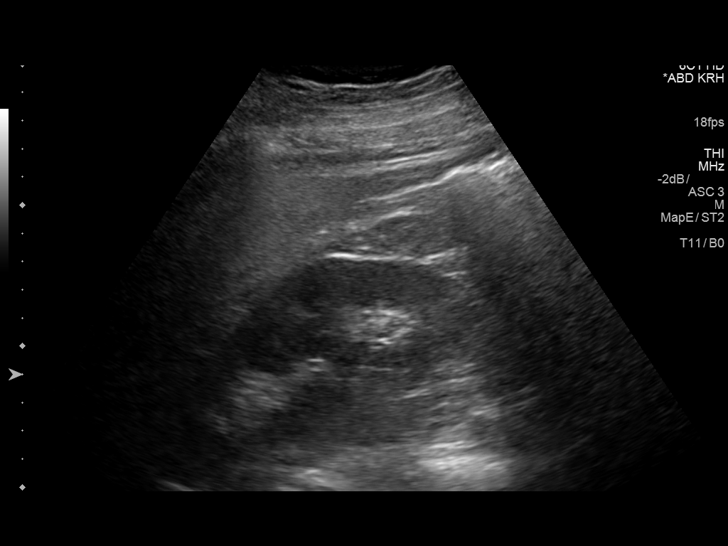
[im 18/34]
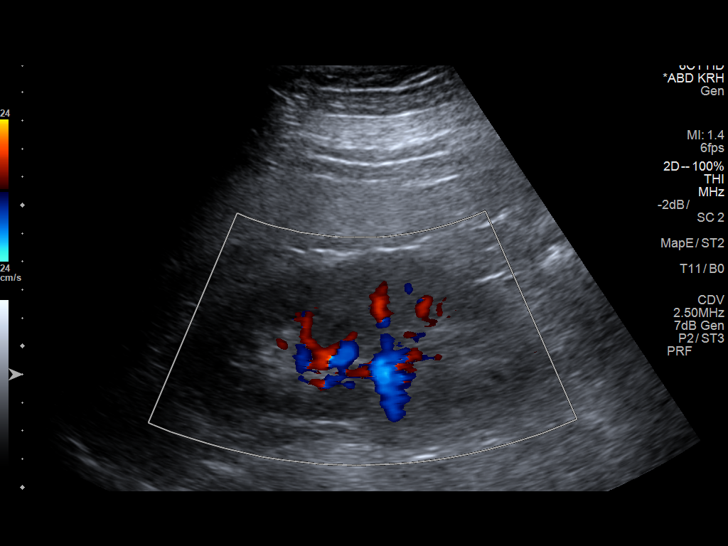
[im 21/34]
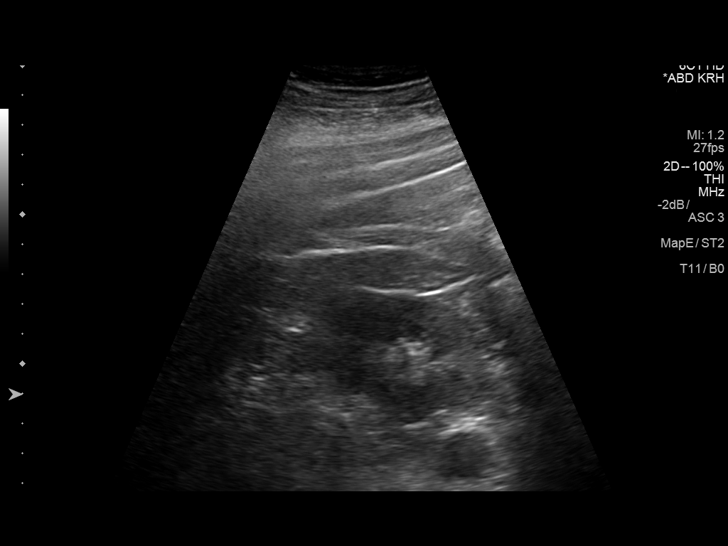
[im 23/34]
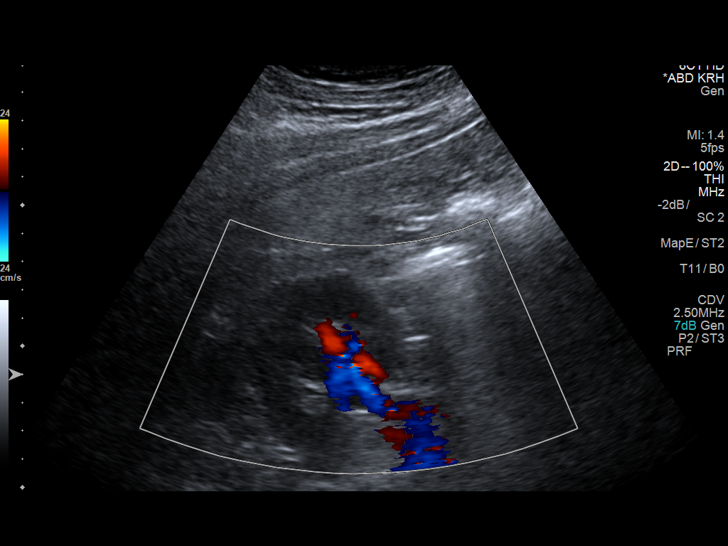
[im 25/34]
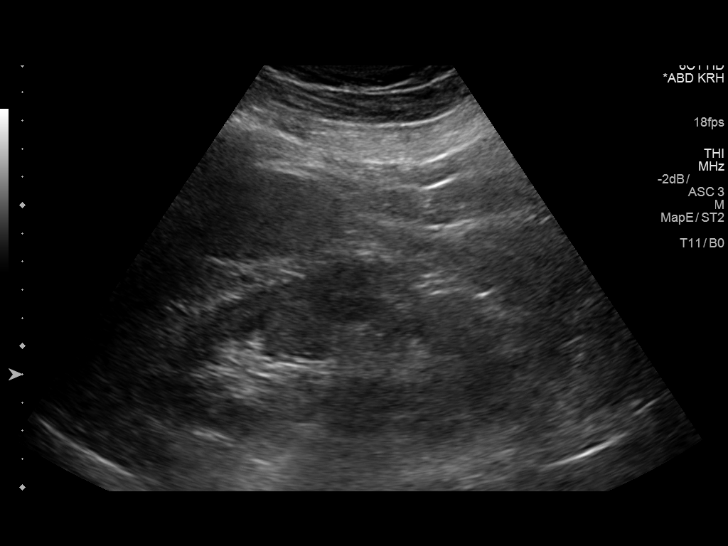
[im 28/34]
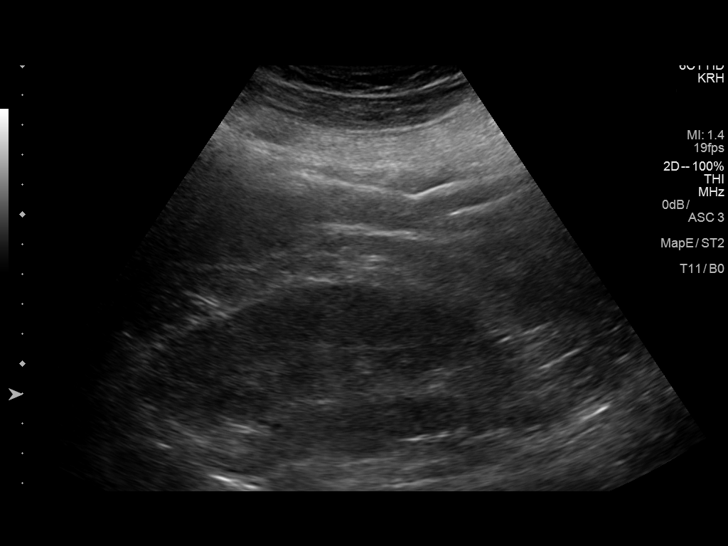
[im 31/34]
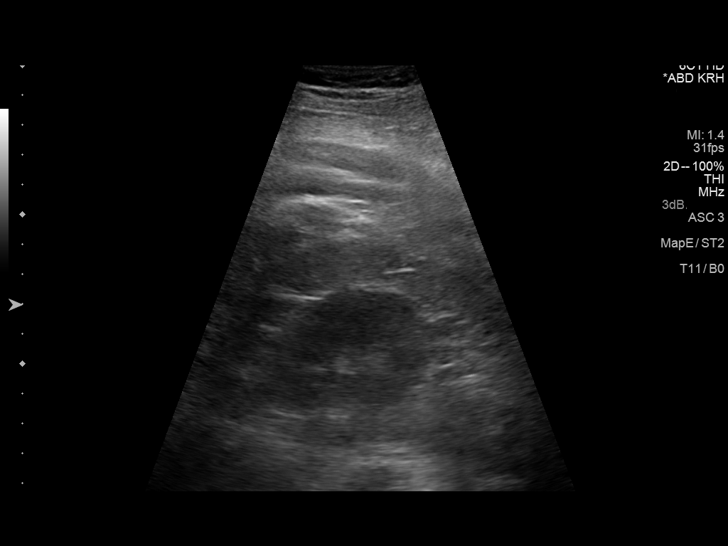
[im 34/34]
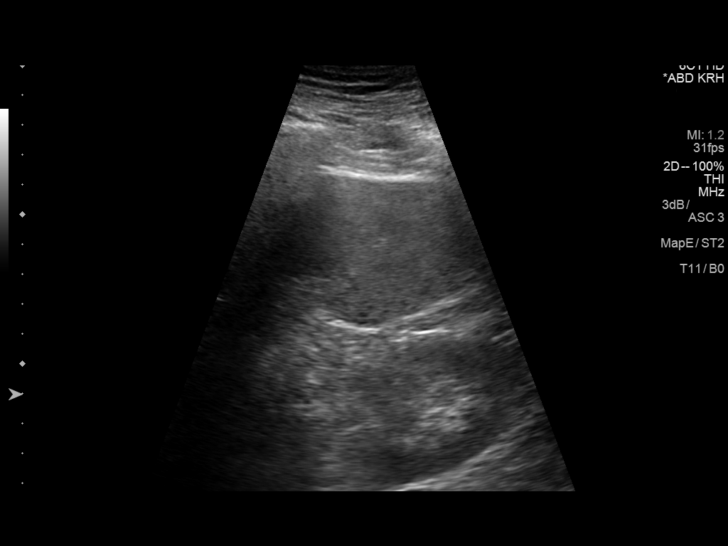

[14 of 25 positions shown; findings below may reference images not displayed]

FINDINGS: Right Kidney:

Length: 12.8 cm.. No hydronephrosis is seen. No renal calculi are
evident by ultrasound.

Left Kidney:

Length: 13.0 cm.. No hydronephrosis is noted and no definite renal
calculi are seen.

Bladder:

The urinary bladder is moderately urine distended. Bilateral
ureteral jets are present.
IMPRESSION: No hydronephrosis. No renal calculi are noted. Bilateral ureteral
jets are visualized.

## 2017-02-07 ENCOUNTER — Ambulatory Visit (INDEPENDENT_AMBULATORY_CARE_PROVIDER_SITE_OTHER): Payer: Managed Care, Other (non HMO) | Admitting: Osteopathic Medicine

## 2017-02-07 ENCOUNTER — Encounter: Payer: Self-pay | Admitting: Osteopathic Medicine

## 2017-02-07 VITALS — BP 136/82 | HR 76 | Ht 71.0 in | Wt 225.0 lb

## 2017-02-07 DIAGNOSIS — R197 Diarrhea, unspecified: Secondary | ICD-10-CM | POA: Diagnosis not present

## 2017-02-07 HISTORY — DX: Diarrhea, unspecified: R19.7

## 2017-02-07 LAB — CBC
HEMATOCRIT: 43.7 % (ref 38.5–50.0)
HEMOGLOBIN: 14.6 g/dL (ref 13.2–17.1)
MCH: 29.7 pg (ref 27.0–33.0)
MCHC: 33.4 g/dL (ref 32.0–36.0)
MCV: 89 fL (ref 80.0–100.0)
MPV: 10 fL (ref 7.5–12.5)
Platelets: 237 10*3/uL (ref 140–400)
RBC: 4.91 MIL/uL (ref 4.20–5.80)
RDW: 13.2 % (ref 11.0–15.0)
WBC: 7.7 10*3/uL (ref 3.8–10.8)

## 2017-02-07 MED ORDER — DICYCLOMINE HCL 20 MG PO TABS: 20.0000 mg | ORAL_TABLET | Freq: Three times a day (TID) | ORAL | 1 refills | Status: DC | PRN

## 2017-02-07 MED ORDER — ONDANSETRON HCL 8 MG PO TABS: 8.0000 mg | ORAL_TABLET | Freq: Three times a day (TID) | ORAL | 1 refills | Status: DC | PRN

## 2017-02-07 NOTE — Progress Notes (Signed)
HPI: Paul Bowen is a 31 y.o. male  who [facility] [place], [facility]07[facility],  for chief complaint of:  Chief Complaint  Patient presents with  . Abdominal Pain  . Diarrhea    GI concerns: Intermittent frequent loose stool/diarrhea, nonbloody, no mucus. Occasionally up to 7 or 8 bowel movements per day, happening maybe a few times per month over the past year. Occasionally associated with mealtimes, no specific food triggers that he notes that dairy tends to make it worse, he has noted some occasional mouth ulcers. His grandmother has lactose allergy he is worried he may be developing this as well. He tries to avoid dairy but sometimes still has ice cream, milk, etc.    Past medical, surgical, social and family history reviewed: Patient Active Problem List   Diagnosis Date Noted  . Dermatitis of foot 10/10/2015  . Fatigue 08/16/2015  . Nephrolithiasis 04/17/2015  . Epididymal cyst 05/03/2013   Past Surgical History:  Procedure Laterality Date  . TONSILLECTOMY    . WISDOM TOOTH EXTRACTION     Social History  Substance Use Topics  . Smoking status: Former Smoker    Quit date: 05/01/2007  . Smokeless tobacco: Not on file  . Alcohol use Yes   Family History  Problem Relation Age of Onset  . Hypertension Mother      Current medication list and allergy/intolerance information reviewed:   Current Outpatient Prescriptions  Medication Sig Dispense Refill  . HYDROcodone-acetaminophen (NORCO) 10-325 MG tablet Take 1 tablet by mouth every 8 (eight) hours as needed. 30 tablet 0  . levofloxacin (LEVAQUIN) 500 MG tablet Take 1 tablet (500 mg total) by mouth daily. 7 tablet 0  . tamsulosin (FLOMAX) 0.4 MG CAPS capsule TAKE ONE CAPSULE BY MOUTH EVERY DAY 30 capsule 0   No current facility-administered medications for this visit.    No Known Allergies    Review of Systems:  Constitutional:  No  fever, no chills, No recent illness, No  unintentional weight changes. No significant fatigue.   HEENT: No  headache, no vision change  Cardiac: No  chest pain, No  pressure, No palpitations,  Respiratory:  No  shortness of breath. No  Cough  Gastrointestinal: +abdominal pain, No  nausea, No  vomiting,  No  blood in stool, +diarrhea, No  constipation   Musculoskeletal: No new myalgia/arthralgia  Skin: No  Rash  Hem/Onc: No  easy bruising/bleeding  Neurologic: No  weakness, No  dizziness  Psychiatric: No  concerns with depression, No  concerns with anxiety  Exam:  BP 136/82   Pulse 76   Ht 5\' 11"  (1.803 m)   Wt 225 lb (102.1 kg)   BMI 31.38 kg/m   Constitutional: VS see above. General Appearance: alert, well-developed, well-nourished, NAD  Eyes: Normal lids and conjunctive, non-icteric sclera  Ears, Nose, Mouth, Throat: MMM, Normal external inspection ears/nares/mouth/lips/gums.  Neck: No masses, trachea midline. No thyroid enlargement. No tenderness/mass appreciated. No lymphadenopathy  Respiratory: Normal respiratory effort. no wheeze, no rhonchi, no rales  Cardiovascular: S1/S2 normal, no murmur, no rub/gallop auscultated. RRR. No lower extremity edema.   Gastrointestinal: Nontender, no masses. No hepatomegaly, no splenomegaly. No hernia appreciated. Bowel sounds normal. Rectal exam deferred.   Musculoskeletal: Gait normal. No clubbing/cyanosis of digits.   Neurological: Normal balance/coordination. No tremor.   Skin: warm, dry, intact. No rash/ulcer    Psychiatric: Normal judgment/insight. Normal mood and affect. Oriented x3.     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 Consider stool study or GI referral if persists despite conservative tx - Plan: dicyclomine (BENTYL) 20 MG tablet, ondansetron (ZOFRAN) 8 MG tablet, CBC, COMPLETE METABOLIC PANEL WITH GFR, TSH, Food Allergy Panel, Lipase, Tissue transglutaminase,  IgA, Tissue transglutaminase, IgG   Patient Instructions   GI issues: I think we are looking at an irritable bowel syndrome here, possibly some food allergies. We are going to get some blood work and try a few treatments with dietary modifications and medications as needed. See below for diet: Foods to avoid. Medications are as follows:  Dicyclomine/Bentyl: To take as needed for abdominal pain/spasm  Ondansetron/Zofran: To take as needed for nausea  can also take, along with Loperamide/Imodium, 30 minutes prior to eating to help prevent diarrhea    2017 UpToDate Characteristics and sources of common FODMAPs  Word that corresponds to letter in acronym Compounds in this category Foods that contain these compounds  F Fermentable  O Oligosaccharides Fructans, galacto-oligosaccharides Wheat, barley, rye, onion, leek, white part of spring onion, garlic, shallots, artichokes, beetroot, fennel, peas, chicory, pistachio, cashews, legumes, lentils, and chickpeas  D Disaccharides Lactose Milk, custard, ice cream, and yogurt  M Monosaccharides "Free fructose" (fructose in excess of glucose) Apples, pears, mangoes, cherries, watermelon, asparagus, sugar snap peas, honey, high-fructose corn syrup  A And  P Polyols Sorbitol, mannitol, maltitol, and xylitol Apples, pears, apricots, cherries, nectarines, peaches, plums, watermelon, mushrooms, cauliflower, artificially sweetened chewing gum and confectionery  FODMAPs: fermentable oligosaccharides, disaccharides, monosaccharides, and polyols. Adapted by permission from [employer]: [employer] Journal of Gastroenterology. Lonell FaceShepherd SJ, Lomer MC, [employer] [place]. Short-chain carbohydrates and functional gastrointestinal disorders. Am J Gastroenterol 2013; 108:707. Copyright  2013. www.nature.com/ajg. Graphic 1610990186 Version 2.0     Visit summary with medication list and pertinent instructions was printed for patient to review. All questions at time of  visit were answered - patient instructed to contact office with any additional concerns. ER/RTC precautions were reviewed with the patient. Follow-up plan: Return in about 4 weeks (around 03/07/2017) for recheck GI symptoms.

## 2017-02-07 NOTE — Patient Instructions (Signed)
GI issues: I think we are looking at an irritable bowel syndrome here, possibly some food allergies. We are going to get some blood work and try a few treatments with dietary modifications and medications as needed. See below for diet: Foods to avoid. Medications are as follows:  Dicyclomine/Bentyl: To take as needed for abdominal pain/spasm  Ondansetron/Zofran: To take as needed for nausea  can also take, along with Loperamide/Imodium, 30 minutes prior to eating to help prevent diarrhea    2017 UpToDate Characteristics and sources of common FODMAPs  Word that corresponds to letter in acronym Compounds in this category Foods that contain these compounds  F Fermentable  O Oligosaccharides Fructans, galacto-oligosaccharides Wheat, barley, rye, onion, leek, white part of spring onion, garlic, shallots, artichokes, beetroot, fennel, peas, chicory, pistachio, cashews, legumes, lentils, and chickpeas  D Disaccharides Lactose Milk, custard, ice cream, and yogurt  M Monosaccharides "Free fructose" (fructose in excess of glucose) Apples, pears, mangoes, cherries, watermelon, asparagus, sugar snap peas, honey, high-fructose corn syrup  A And  P Polyols Sorbitol, mannitol, maltitol, and xylitol Apples, pears, apricots, cherries, nectarines, peaches, plums, watermelon, mushrooms, cauliflower, artificially sweetened chewing gum and confectionery  FODMAPs: fermentable oligosaccharides, disaccharides, monosaccharides, and polyols. Adapted by permission from [employer]: [employer] Journal of Gastroenterology. Lonell FaceShepherd SJ, Lomer MC, [employer] [place]. Short-chain carbohydrates and functional gastrointestinal disorders. Am J Gastroenterol 2013; 108:707. Copyright  2013. www.nature.com/ajg. Graphic [id number] Version 2.0

## 2017-02-08 LAB — COMPLETE METABOLIC PANEL WITH GFR
ALBUMIN: 4.4 g/dL (ref 3.6–5.1)
ALK PHOS: 45 U/L (ref 40–115)
ALT: 24 U/L (ref 9–46)
AST: 17 U/L (ref 10–40)
BILIRUBIN TOTAL: 0.4 mg/dL (ref 0.2–1.2)
BUN: 12 mg/dL (ref 7–25)
CALCIUM: 9.2 mg/dL (ref 8.6–10.3)
CO2: 24 mmol/L (ref 20–31)
CREATININE: 0.71 mg/dL (ref 0.60–1.35)
Chloride: 104 mmol/L (ref 98–110)
GFR, Est African American: >89 mL/min (ref >=60)
GFR, Est Non African American: >89 mL/min (ref >=60)
Glucose, Bld: 82 mg/dL (ref 65–99)
Potassium: 4.3 mmol/L (ref 3.5–5.3)
Sodium: 138 mmol/L (ref 135–146)
TOTAL PROTEIN: 6.5 g/dL (ref 6.1–8.1)

## 2017-02-08 LAB — ALLERGEN, CORN F8

## 2017-02-08 LAB — ALLERGEN, CLAM, F207

## 2017-02-08 LAB — TSH: TSH: 1.17 m[IU]/L (ref 0.40–4.50)

## 2017-02-08 LAB — LIPASE: Lipase: 15 U/L (ref 7–60)

## 2017-02-10 ENCOUNTER — Telehealth: Payer: Self-pay | Admitting: *Deleted

## 2017-02-10 DIAGNOSIS — R197 Diarrhea, unspecified: Secondary | ICD-10-CM

## 2017-02-10 LAB — TISSUE TRANSGLUTAMINASE, IGG: Tissue Transglut Ab: 1 U/mL (ref <6)

## 2017-02-10 LAB — TISSUE TRANSGLUTAMINASE, IGA: Tissue Transglutaminase Ab, IgA: 1 U/mL (ref <4)

## 2017-02-10 NOTE — Telephone Encounter (Signed)
[facility] called and they wanted to confirm the lab orders they have. One order they mention that I didn't see an order for was for clam and corn. I told them there was a food allergy panel order in but not a specific order for clam or corn nor did I see anything in the note to confirm this. So I asked that they cancel clam and corn. Can you please confirm this?

## 2017-02-10 NOTE — Telephone Encounter (Signed)
I think these may be included on allergy panel but I am not certain, I went ahead and put in individual orders for these - can call lab and see if these can be added onto the current specimen or patient will have to come back for additional blood draw. I don't believe the patient and I discussed specifically testing for these.

## 2017-02-11 LAB — FOOD ALLERGY PROFILE W/ REFLEXES
Allergen, Salmon, f41: <0.1 kU/L
Almonds: <0.1 kU/L
Egg White IgE: <0.1 kU/L
Fish Cod: <0.1 kU/L
Hazelnut: 0.55 kU/L — ABNORMAL HIGH
Milk IgE: 0.94 kU/L — ABNORMAL HIGH
Peanut IgE: <0.1 kU/L
Soybean IgE: <0.1 kU/L
WHEAT IGE: 0.11 kU/L — AB

## 2017-02-11 LAB — MILK COMPONENT PANEL
ALLERGEN, ALPHA-LACTALB,F76: 0.11 kU/L — AB
Allergen, Beta-lactoglob,f77: 1.23 kU/L — ABNORMAL HIGH
Allergen, Casein, f78: <0.1 kU/L

## 2017-02-12 NOTE — Telephone Encounter (Signed)
labs added 

## 2017-02-14 LAB — ALLERGEN, CORN, IGG4: Allergen, Corn, IgG4: <0.15 ug/mL (ref <0.15)

## 2017-02-18 ENCOUNTER — Encounter: Payer: Self-pay | Admitting: Osteopathic Medicine

## 2017-02-19 LAB — ALLERGEN, CLAM IGG: CLAM IGG: 2.5 ug/mL — AB (ref <2.0)

## 2017-02-20 ENCOUNTER — Telehealth: Payer: Self-pay | Admitting: Osteopathic Medicine

## 2017-02-20 NOTE — Telephone Encounter (Signed)
See my-chart messages for details. Labs should have been added on to initial order but weren't, if the lab charges this patient a fee for blood draw or any kind of additional co-pay, can we get this waived? I told the patient that he would still be charged for the labs and cells. Thanks.

## 2017-03-07 ENCOUNTER — Ambulatory Visit: Payer: Managed Care, Other (non HMO) | Admitting: Osteopathic Medicine

## 2017-03-13 ENCOUNTER — Other Ambulatory Visit: Payer: Self-pay | Admitting: Osteopathic Medicine

## 2017-03-13 LAB — CBC
HEMATOCRIT: 44.8 % (ref 38.5–50.0)
Hemoglobin: 15.2 g/dL (ref 13.2–17.1)
MCH: 30.4 pg (ref 27.0–33.0)
MCHC: 33.9 g/dL (ref 32.0–36.0)
MCV: 89.6 fL (ref 80.0–100.0)
MPV: 9.9 fL (ref 7.5–12.5)
Platelets: 187 10*3/uL (ref 140–400)
RBC: 5 MIL/uL (ref 4.20–5.80)
RDW: 13.4 % (ref 11.0–15.0)
WBC: 6.7 10*3/uL (ref 3.8–10.8)

## 2017-03-14 LAB — ALLERGEN EGG WHITE F1: Egg White IgE: <0.1 kU/L

## 2017-03-14 LAB — COMPLETE METABOLIC PANEL WITH GFR
ALT: 24 U/L (ref 9–46)
AST: 19 U/L (ref 10–40)
Albumin: 4.9 g/dL (ref 3.6–5.1)
Alkaline Phosphatase: 44 U/L (ref 40–115)
BUN: 14 mg/dL (ref 7–25)
CALCIUM: 9.9 mg/dL (ref 8.6–10.3)
CHLORIDE: 104 mmol/L (ref 98–110)
CO2: 20 mmol/L (ref 20–32)
CREATININE: 0.82 mg/dL (ref 0.60–1.35)
GFR, Est African American: >89 mL/min (ref >=60)
GFR, Est Non African American: >89 mL/min (ref >=60)
Glucose, Bld: 111 mg/dL — ABNORMAL HIGH (ref 65–99)
Potassium: 4.3 mmol/L (ref 3.5–5.3)
Sodium: 140 mmol/L (ref 135–146)
TOTAL PROTEIN: 6.9 g/dL (ref 6.1–8.1)
Total Bilirubin: 0.6 mg/dL (ref 0.2–1.2)

## 2017-03-14 LAB — ALLERGEN MILK: MILK IGE: 0.94 kU/L — AB

## 2017-03-14 LAB — LIPASE: Lipase: 16 U/L (ref 7–60)

## 2017-03-14 LAB — ALLERGEN, FISH, COD F3

## 2017-03-14 LAB — ALLERGEN WALNUT F256

## 2017-03-14 LAB — TSH: TSH: 0.92 mIU/L (ref 0.40–4.50)

## 2017-03-14 LAB — ALLERGEN, SHRIMP F24

## 2017-03-14 LAB — ALLERGEN, WHEAT, F4: WHEAT IGE: 0.15 kU/L — AB

## 2017-03-14 LAB — ALLERGEN SOYBEAN: Soybean IgE: <0.1 kU/L

## 2017-03-14 LAB — ALLERGEN, PEANUT F13: Peanut IgE: <0.1 kU/L

## 2017-03-18 LAB — ALLERGEN, CORN, IGG4: Allergen, Corn, IgG4: <0.15 ug/mL (ref <0.15)

## 2017-03-18 LAB — TISSUE TRANSGLUTAMINASE, IGG: Tissue Transglut Ab: 1 U/mL (ref <6)

## 2017-03-18 LAB — TISSUE TRANSGLUTAMINASE, IGA: Tissue Transglutaminase Ab, IgA: 1 U/mL (ref <4)

## 2017-03-21 ENCOUNTER — Ambulatory Visit (INDEPENDENT_AMBULATORY_CARE_PROVIDER_SITE_OTHER): Payer: Managed Care, Other (non HMO) | Admitting: Osteopathic Medicine

## 2017-03-21 ENCOUNTER — Encounter: Payer: Self-pay | Admitting: Osteopathic Medicine

## 2017-03-21 VITALS — BP 130/74 | HR 61 | Temp 98.0°F | Ht 70.0 in | Wt 219.0 lb

## 2017-03-21 DIAGNOSIS — R197 Diarrhea, unspecified: Secondary | ICD-10-CM | POA: Diagnosis not present

## 2017-03-21 DIAGNOSIS — B353 Tinea pedis: Secondary | ICD-10-CM

## 2017-03-21 DIAGNOSIS — Z91011 Allergy to milk products, unspecified: Secondary | ICD-10-CM

## 2017-03-21 MED ORDER — TERBINAFINE HCL 250 MG PO TABS: 250.0000 mg | ORAL_TABLET | Freq: Every day | ORAL | 0 refills | Status: AC

## 2017-03-21 MED ORDER — CICLOPIROX OLAMINE 0.77 % EX CREA: TOPICAL_CREAM | Freq: Two times a day (BID) | CUTANEOUS | 1 refills | Status: DC

## 2017-03-21 NOTE — Progress Notes (Signed)
HPI: Paul Bowen is a 31 y.o. male  who presents to [facility] today, 03/21/17,  for chief complaint of:  Chief Complaint  Patient presents with  . Follow-up    GI issues/ diarrhea    Following up to recheck intermittent diarrhea. Medications Zofran and Bentyl caused some nausea and sleepiness so he really didn't take these. He has noticed a great benefit from cutting out almost all milk. Lab results were reviewed in detail, he does have some antibodies to wheat though quite low and tissue transglutaminases were negative, clam antibodies were also present.  Reports tinea pedis worse on right foot between toes of great and second toe. Has tried over-the-counter treatments which have not been particularly helpful.   Past medical history, surgical history, social history and family history reviewed.  Patient Active Problem List   Diagnosis Date Noted  . Intermittent diarrhea 02/07/2017  . Dermatitis of foot 10/10/2015  . Fatigue 08/16/2015  . Nephrolithiasis 04/17/2015  . Epididymal cyst 05/03/2013    Current medication list and allergy/intolerance information reviewed.   No current outpatient prescriptions on file prior to visit.   No current facility-administered medications on file prior to visit.    No Known Allergies    Review of Systems:  Constitutional: No recent illness  Gastrointestinal: No  abdominal pain  Musculoskeletal: No new myalgia/arthralgia  Skin: +Rash  Neurologic: No  weakness, No  Dizziness  Exam:  BP 130/74   Pulse 61   Temp 98 F (36.7 C) (Oral)   Ht  (1.778 m)   Wt 219 lb (99.3 kg)   BMI 31.42 kg/m   Constitutional: VS see above. General Appearance: alert, well-developed, well-nourished, NADs.  Neck: No masses, trachea midline.   Respiratory: Normal respiratory effort.   Musculoskeletal: Gait normal. Symmetric and independent movement of all extremities  Neurological: Normal  balance/coordination. No tremor.  Skin: warm, dry, intact. Scaly red patch consistent with tinea on dorsal aspect right foot between great toe and second toe  Psychiatric: Normal judgment/insight. Normal mood and affect. Oriented x3.    Recent Results (from the past 2160 hour(s))  CBC     Status: None   Collection Time: 02/07/17 10:59 AM  Result Value Ref Range   WBC 7.7 3.8 - 10.8 K/uL   RBC 4.91 4.20 - 5.80 MIL/uL   Hemoglobin 14.6 13.2 - 17.1 g/dL   HCT 16.1 09.6 - 04.5 %   MCV 89.0 80.0 - 100.0 fL   MCH 29.7 27.0 - 33.0 pg   MCHC 33.4 32.0 - 36.0 g/dL   RDW 40.9 81.1 - 91.4 %   Platelets 237 140 - 400 K/uL   MPV 10.0 7.5 - 12.5 fL  COMPLETE METABOLIC PANEL WITH GFR     Status: None   Collection Time: 02/07/17 10:59 AM  Result Value Ref Range   Sodium 138 135 - 146 mmol/L   Potassium 4.3 3.5 - 5.3 mmol/L   Chloride 104 98 - 110 mmol/L   CO2 24 20 - 31 mmol/L   Glucose, Bld 82 65 - 99 mg/dL   BUN 12 7 - 25 mg/dL   Creat 7.82 9.56 - 2.13 mg/dL   Total Bilirubin 0.4 0.2 - 1.2 mg/dL   Alkaline Phosphatase 45 40 - 115 U/L   AST 17 10 - 40 U/L   ALT 24 9 - 46 U/L   Total Protein 6.5 6.1 - 8.1 g/dL   Albumin 4.4 3.6 - 5.1 g/dL  Calcium 9.2 8.6 - 10.3 mg/dL   GFR, Est African American >89 >=60 mL/min   GFR, Est Non African American >89 >=60 mL/min  TSH     Status: None   Collection Time: 02/07/17 10:59 AM  Result Value Ref Range   TSH 1.17 0.40 - 4.50 mIU/L  Lipase     Status: None   Collection Time: 02/07/17 10:59 AM  Result Value Ref Range   Lipase 15 7 - 60 U/L  Tissue transglutaminase, IgA     Status: None   Collection Time: 02/07/17 10:59 AM  Result Value Ref Range   Tissue Transglutaminase Ab, IgA 1 <4 U/mL    Comment: Value Interpretation:   <4:   Antibody Not Detected  >=4:   Antibody Detected   Tissue transglutaminase, IgG     Status: None   Collection Time: 02/07/17 10:59 AM  Result Value Ref Range   Tissue Transglut Ab 1 <6 U/mL    Comment: Value  Interpretation:   <6:   Antibody Not Detected  >=6:   Antibody Detected   Corn IgE     Status: None (Preliminary result)   Collection Time: 02/07/17 10:59 AM  Result Value Ref Range   Corn  kU/L  Allergen, Clam, f207     Status: None (Preliminary result)   Collection Time: 02/07/17 10:59 AM  Result Value Ref Range   Clams  kU/L  Food Allergy Profile w/ Reflexes     Status: Abnormal   Collection Time: 02/07/17 10:59 AM  Result Value Ref Range   Egg White IgE <0.10 kU/L   Milk IgE 0.94 (H) kU/L   Fish Cod <0.10 kU/L   Wheat IgE 0.11 (H) kU/L   Peanut IgE <0.10 kU/L   Soybean IgE <0.10 kU/L   Shrimp IgE <0.10 kU/L   Tuna IgE <0.10 kU/L   Allergen, Salmon, f41 <0.10 kU/L   Sesame Seed f10  <0.10 kU/L   Scallop IgE <0.10 kU/L   Almonds <0.10 kU/L   Cashew IgE <0.10 kU/L   Walnut <0.10 kU/L   Hazelnut 0.55 (H) kU/L  Milk Component Panel     Status: Abnormal   Collection Time: 02/07/17 10:59 AM  Result Value Ref Range   Allergen, Casein, f78 <0.10 kU/L   Allergen, Alpha-lactalb,f76 0.11 (H) kU/L   Allergen, Beta-lactoglob,f77 1.23 (H) kU/L    Comment:   Footnotes:  (1)        ----------------------------------------------------      Class   Specific IgE (kU/L)   Level      ----------------------------------------------------      0       <0.10                 Absent or undetectable      0/1     0.10  -   0.34        Equivocal/Borderline      1       0.35  -   0.69        Low      2       0.70  -   3.49        Moderate      3       3.50  -  17.49        High      4       17.50 -  49.99        Very High  5       50.00 - 100.00        Very High      6       >100.00               Very High   Allergen, Corn, IgG4     Status: None   Collection Time: 02/10/17  6:05 AM  Result Value Ref Range   Allergen, Corn, IgG4 <0.15 <0.15 mcg/mL    Comment: The reference range listed on the report is the lower limit of  quantitation for the assay. The clinical utility of  food-specific  IgG4 tests has not been clearly established. These tests can be used  in special clinical situations to select foods for evaluation by diet  elimination and challenge in patients who have food-related  complaints, and to evaluate food allergic patients prior to food  challenges.The presence of food-specific IgG4 alone cannot be taken  as evidence of food allergy and only indicates immunologic  sensitization to the food allergen in question. *This test was developed and its performance characteristics  determined by [employer]. It has not been cleared or approved  by the U.S. Food and Drug Administration.   Allergen, Clam IgG     Status: Abnormal   Collection Time: 02/10/17  6:05 AM  Result Value Ref Range   Clam IgG 2.5 (H) <2.0 mcg/mL    Comment:    The reference range listed on the report is the lower limit of  quantitation for the assay. The clinical utility of food-specific IgG tests has not been established. These tests can be used in  special clinical situations to select foods for evaluation by  diet elimination and challenge in patients who have food-related  complaints. It should be recognized that the presence of  food-specific IgG alone cannot be taken as evidence of food  allergy and only indicates immunologic sensitization by the food  allergen in question. This test should only be ordered by  physicians who recognize the limitations of the test.    *This test was developed and its performance characteristics  determined by [facility]. It has not been cleared  or approved by the U.S. Food and Drug Administration.    Test(s) performed at:     [facility]     Duanne Limerick, Lab Director     [address] [address]     No results found.  No flowsheet data found.  Depression screen [facility] 2/9 03/21/2017 02/07/2017  Decreased Interest 0 0  Down, Depressed, Hopeless 0 0  PHQ - 2 Score 0  0  Altered sleeping 0 -  Tired, decreased energy 0 -  Change in appetite 0 -  Feeling bad or failure about yourself  0 -  Trouble concentrating 0 -  Moving slowly or fidgety/restless 0 -  Suicidal thoughts 0 -  PHQ-9 Score 0 -      ASSESSMENT/PLAN:    Milk allergy - Avoidance advised  Intermittent diarrhea - Improved with elimination diet, no milk products  Tinea pedis of both feet - Plan: terbinafine (LAMISIL) 250 MG tablet, ciclopirox (LOPROX) 0.77 % cream    Patient Instructions  Plan: Topical antifungal (ciclopirox) to feet, can fill Rx for oral meds if still no better after 1-2 weeks on the topical treatment     Follow-up plan: Return for ANNUAL PHYSICAL WHEN DUE .  Visit summary with medication list and  pertinent instructions was printed for patient to review, alert [place] if any changes needed. All questions at time of visit were answered - patient instructed to contact office with any additional concerns. ER/RTC precautions were reviewed with the patient and understanding verbalized.   Note: Total time spent 25 minutes, greater than 50% of the visit was spent face-to-face counseling and coordinating care for the following: The primary encounter diagnosis was Milk allergy. Diagnoses of Intermittent diarrhea and Tinea pedis of both feet were also pertinent to this visit.Marland Kitchen

## 2017-03-21 NOTE — Patient Instructions (Signed)
Plan: Topical antifungal (ciclopirox) to feet, can fill Rx for oral meds if still no better after 1-2 weeks on the topical treatment

## 2017-03-24 LAB — ALLERGEN, CLAM IGG: CLAM IGG: 2.1 ug/mL — AB (ref <2.0)

## 2017-04-28 ENCOUNTER — Encounter: Payer: Self-pay | Admitting: Osteopathic Medicine

## 2017-04-28 DIAGNOSIS — Z1283 Encounter for screening for malignant neoplasm of skin: Secondary | ICD-10-CM

## 2017-06-11 ENCOUNTER — Encounter: Payer: Self-pay | Admitting: Osteopathic Medicine

## 2017-06-11 DIAGNOSIS — L989 Disorder of the skin and subcutaneous tissue, unspecified: Secondary | ICD-10-CM

## 2017-06-11 HISTORY — DX: Disorder of the skin and subcutaneous tissue, unspecified: L98.9

## 2017-06-13 ENCOUNTER — Encounter: Payer: Self-pay | Admitting: *Deleted

## 2017-06-13 ENCOUNTER — Other Ambulatory Visit: Payer: Self-pay

## 2017-06-13 ENCOUNTER — Emergency Department (INDEPENDENT_AMBULATORY_CARE_PROVIDER_SITE_OTHER)
Admission: EM | Admit: 2017-06-13 | Discharge: 2017-06-13 | Disposition: A | Discharge status: Home / Self Care | Payer: Managed Care, Other (non HMO) | Source: Home / Self Care | Attending: Family Medicine | Admitting: Family Medicine

## 2017-06-13 DIAGNOSIS — R9431 Abnormal electrocardiogram [ECG] [EKG]: Secondary | ICD-10-CM | POA: Diagnosis not present

## 2017-06-13 DIAGNOSIS — R0789 Other chest pain: Secondary | ICD-10-CM

## 2017-06-13 MED ORDER — ASPIRIN 325 MG PO TABS
325.0000 mg | ORAL_TABLET | Freq: Once | ORAL | Status: AC
  Administered 2017-06-13: 325 mg via ORAL
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
## 2017-06-13 NOTE — Discharge Instructions (Signed)
°  Your chest pain is most likely due to muscle and cartilage inflammation rather than your heart but it is recommended that you follow up with your primary care provider early next week for recheck of symptoms if not improving before your vacation.  If symptoms worsening this evening or this weekend- increased pain, difficulty breathing, nausea/vomiting, breaking out into a sweat, or other new concerning symptoms develop, please do not hesitate to call 911 or go to closest emergency department for further evaluation.

## 2017-06-13 NOTE — ED Triage Notes (Signed)
Pt c/o LT sided CP since after supper tonight at 1830. His chest in tender to touch and swollen. He reports some tingling and numbness down his LT arm.

## 2017-06-14 ENCOUNTER — Telehealth: Payer: Self-pay | Admitting: *Deleted

## 2017-06-14 NOTE — Telephone Encounter (Signed)
Callback: No answer. LMOM f/u from visit. Encouraged f/u with PCP and ER if symptoms change or worsen. Call back as needed.

## 2019-04-22 ENCOUNTER — Emergency Department (INDEPENDENT_AMBULATORY_CARE_PROVIDER_SITE_OTHER): Payer: Managed Care, Other (non HMO)

## 2019-04-22 ENCOUNTER — Emergency Department
Admission: EM | Admit: 2019-04-22 | Discharge: 2019-04-22 | Disposition: A | Discharge status: Home / Self Care | Payer: Managed Care, Other (non HMO) | Source: Home / Self Care

## 2019-04-22 ENCOUNTER — Other Ambulatory Visit: Payer: Self-pay

## 2019-04-22 DIAGNOSIS — M79672 Pain in left foot: Secondary | ICD-10-CM

## 2019-04-22 DIAGNOSIS — L03116 Cellulitis of left lower limb: Secondary | ICD-10-CM | POA: Diagnosis not present

## 2019-04-22 DIAGNOSIS — B353 Tinea pedis: Secondary | ICD-10-CM

## 2019-04-22 MED ORDER — CEPHALEXIN 500 MG PO CAPS: 500.0000 mg | ORAL_CAPSULE | Freq: Two times a day (BID) | ORAL | 0 refills | Status: DC

## 2019-04-22 MED ORDER — CLOTRIMAZOLE 1 % EX CREA: TOPICAL_CREAM | CUTANEOUS | 0 refills | Status: DC

## 2019-04-22 NOTE — ED Triage Notes (Signed)
Pt c/o LT foot pain and swelling since Tuesday. Denies any injury. Says he was at work and took his shoe off when he noticed swelling and a "knot" on the inside portion of his foot. Has tried icing it, epsom soaks and ibuprofen with no relief.

## 2019-04-22 NOTE — Discharge Instructions (Signed)
°  You may take 500mg  acetaminophen every 4-6 hours or in combination with ibuprofen 400-600mg  every 6-8 hours as needed for pain and inflammation.  Please take antibiotics as prescribed and be sure to complete entire course even if you start to feel better to ensure infection does not come back.  Please follow up with Sports Medicine next week if symptoms not improving.

## 2019-04-22 NOTE — ED Provider Notes (Signed)
[facility]    CSN: [id number] Arrival date & time: 04/22/19  0806      History   Chief Complaint Chief Complaint  Patient presents with  . Foot Pain    LT    HPI Paul Bowen is a 33 y.o. male.   HPI Paul Bowen is a 33 y.o. male presenting to UC with c/o Left foot pain, redness, swelling and a tender bump near the arch of his foot that he noticed 2 days ago after taking his work boots off. No known injury or insect bite. No hx of gout. He has tried Epson salt soaks, ice, and ibuprofen w/o relief.   Pain is aching and sore, 5/10. Pt notes he also has a foot fungus on his toes that he has had for years but notes the tried itchy skin cracks at times. He is wondering if there is any prescription he can try for those symptoms.  BP elevated in triage, hx of same. 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 He denies HA, dizziness or chest pain.   Past Medical History:  Diagnosis Date  . Hematuria     Patient Active Problem List   Diagnosis Date Noted  . Skin problem 06/11/2017  . Intermittent diarrhea 02/07/2017  . Dermatitis of foot 10/10/2015  . Fatigue 08/16/2015  . Nephrolithiasis 04/17/2015  . Epididymal cyst 05/03/2013    Past Surgical History:  Procedure Laterality Date  . TONSILLECTOMY    . WISDOM TOOTH EXTRACTION         Home Medications    Prior to Admission medications   Medication Sig Start Date End Date Taking? Authorizing Provider  cephALEXin (KEFLEX) 500 MG capsule Take 1 capsule (500 mg total) by mouth 2 (two) times daily. 04/22/19   Noe Gens, PA-C  ciclopirox (LOPROX) 0.77 % cream Apply topically 2 (two) times daily. To affected skin 03/21/17   Emeterio Reeve, DO  clotrimazole (LOTRIMIN) 1 % cream Apply to affected area 2 times daily for up to 4 weeks 04/22/19   Noe Gens, PA-C    Family History Family History   Problem Relation Age of Onset  . Hypertension Mother     Social History Social History   Tobacco Use  . Smoking status: Former Smoker    Quit date: 05/01/2007    Years since quitting: 11.9  . Smokeless tobacco: Never Used  Substance Use Topics  . Alcohol use: Yes  . Drug use: No     Allergies   Patient has no known allergies.   Review of Systems Review of Systems  Constitutional: Negative for chills and fever.  Musculoskeletal: Positive for arthralgias and joint swelling. Negative for gait problem.  Skin: Positive for color change and wound.  Neurological: Negative for weakness and numbness.     Physical Exam Triage Vital Signs ED Triage Vitals [04/22/19 0823]  Enc Vitals Group     BP (!) 158/109     Pulse Rate 78     Resp 18     Temp 97.9 F (36.6 C)     Temp Source Oral     SpO2 98 %     Weight 239 lb (108.4 kg)     Height 5\' 10"  (1.778 m)     Head Circumference      Peak Flow      Pain  Score 5     Pain Loc      Pain Edu?      Excl. in GC?    No data found.  Updated Vital Signs BP (!) 136/94 (BP Location: Left Arm)   Pulse 78   Temp 97.9 F (36.6 C) (Oral)   Resp 18   Ht 5\' 10"  (1.778 m)   Wt 239 lb (108.4 kg)   SpO2 98%   BMI 34.29 kg/m   Visual Acuity Right Eye Distance:   Left Eye Distance:   Bilateral Distance:    Right Eye Near:   Left Eye Near:    Bilateral Near:     Physical Exam Vitals signs and nursing note reviewed.  Constitutional:      Appearance: Normal appearance. He is well-developed.  HENT:     Head: Normocephalic and atraumatic.  Neck:     Musculoskeletal: Normal range of motion.  Cardiovascular:     Rate and Rhythm: Normal rate.  Pulmonary:     Effort: Pulmonary effort is normal.  Musculoskeletal: Normal range of motion.        General: Swelling and tenderness present.       Feet:  Skin:    General: Skin is warm and dry.     Findings: Erythema present.  Neurological:     Mental Status: He is alert and  oriented to person, place, and time.  Psychiatric:        Behavior: Behavior normal.      UC Treatments / Results  Labs (all labs ordered are listed, but only abnormal results are displayed) Labs Reviewed - No data to display  EKG   Radiology Dg Foot Complete Left  Result Date: 04/22/2019 CLINICAL DATA:  LEFT foot pain, swelling and redness for 1 week. EXAM: LEFT FOOT - COMPLETE 3+ VIEW COMPARISON:  None. FINDINGS: No fracture, subluxation or dislocation identified. No radiographic evidence of osteomyelitis noted. MEDIAL soft tissue swelling is noted. No radiopaque foreign bodies are identified. IMPRESSION: Soft tissue swelling without acute bony abnormality. Electronically Signed   By: 06/22/2019 M.D.   On: 04/22/2019 09:17    Procedures Procedures (including critical care time)  Medications Ordered in UC Medications - No data to display  Initial Impression / Assessment and Plan / UC Course  I have reviewed the triage vital signs and the nursing notes.  Pertinent labs & imaging results that were available during my care of the patient were reviewed by me and considered in my medical decision making (see chart for details).     Concern for possible early cellulitis, still possible gout, atypical location. Will start on Keflex and treat with OTC NSAIDs.  Toes- dried cracked itchy skin c/w tinea pedis.  Will tx with lotrimin   AVS provided  Final Clinical Impressions(s) / UC Diagnoses   Final diagnoses:  Cellulitis of left foot  Tinea pedis of left foot     Discharge Instructions      You may take 500mg  acetaminophen every 4-6 hours or in combination with ibuprofen 400-600mg  every 6-8 hours as needed for pain and inflammation.  Please take antibiotics as prescribed and be sure to complete entire course even if you start to feel better to ensure infection does not come back.  Please follow up with Sports Medicine next week if symptoms not improving.        ED Prescriptions    Medication Sig Dispense Auth. Provider   cephALEXin (KEFLEX) 500 MG capsule Take 1 capsule (  500 mg total) by mouth 2 (two) times daily. 14 capsule Doroteo GlassmanPhelps, Vernia Teem O, PA-C   clotrimazole (LOTRIMIN) 1 % cream Apply to affected area 2 times daily for up to 4 weeks 24 g Lurene ShadowPhelps, Verland Sprinkle O, [place]PA-C     I have reviewed the PDMP during this encounter.   Lurene Shadowhelps, Rubylee Zamarripa O, PA-C 04/22/19 1019

## 2019-04-29 ENCOUNTER — Telehealth: Payer: Self-pay | Admitting: Osteopathic Medicine

## 2019-04-29 NOTE — Telephone Encounter (Signed)
FYI:Pt called for an appointment. He was seen at the Urgent Care last Thursday and was diagnosed with Cellulitis of left foot. Pt said he's still having swelling and is done with antibiotic and now  there is a hard knot on his foot. Pt said he was also exposed to [place] at his job and would be at the end of it by next Wednesday. I spoke to triage and was told to speak to the Doctor because of the [place] exposure. I spoke to Dr A and she said patient needs to do a virtual visit. Patient was not happy with doing a  virutal visit because of his foot being swollen, he wanted to know how the Doctor would be able to see his foot. I spoke to Dr. Loni Muse and she said he can use his smart phone to show her his foot and from there she would determine if he needed to go to the ER. Patient said he was not comfortable using his smartphone and then he added this is why he has avoided seeing his Provider for two years. He said to let her know that will be looking for another Provider.

## 2019-04-30 NOTE — Telephone Encounter (Signed)
Removed my name as PCP see notes

## 2019-05-06 ENCOUNTER — Ambulatory Visit: Payer: Managed Care, Other (non HMO) | Admitting: Osteopathic Medicine

## 2019-08-26 ENCOUNTER — Emergency Department
Admission: EM | Admit: 2019-08-26 | Discharge: 2019-08-26 | Disposition: A | Discharge status: Home / Self Care | Payer: Managed Care, Other (non HMO) | Source: Home / Self Care

## 2019-08-26 ENCOUNTER — Other Ambulatory Visit: Payer: Self-pay

## 2019-08-26 ENCOUNTER — Ambulatory Visit: Payer: Managed Care, Other (non HMO) | Admitting: Family Medicine

## 2019-08-26 DIAGNOSIS — R030 Elevated blood-pressure reading, without diagnosis of hypertension: Secondary | ICD-10-CM | POA: Diagnosis not present

## 2019-08-26 DIAGNOSIS — I517 Cardiomegaly: Secondary | ICD-10-CM

## 2019-08-26 DIAGNOSIS — I10 Essential (primary) hypertension: Secondary | ICD-10-CM

## 2019-08-26 DIAGNOSIS — R0789 Other chest pain: Secondary | ICD-10-CM

## 2019-08-26 LAB — BASIC METABOLIC PANEL
BUN: 12 mg/dL (ref 7–25)
CO2: 28 mmol/L (ref 20–32)
Calcium: 9.3 mg/dL (ref 8.6–10.3)
Chloride: 106 mmol/L (ref 98–110)
Creat: 0.78 mg/dL (ref 0.60–1.35)
Glucose, Bld: 94 mg/dL (ref 65–99)
Potassium: 4.9 mmol/L (ref 3.5–5.3)
Sodium: 139 mmol/L (ref 135–146)

## 2019-08-26 MED ORDER — AMLODIPINE BESYLATE 5 MG PO TABS: 5.0000 mg | ORAL_TABLET | Freq: Every day | ORAL | 0 refills | Status: DC

## 2019-08-26 NOTE — Discharge Instructions (Addendum)
If chest pain, dizziness, or headache worsens and does not improve with rest go immediately to the emergency department for further work-up and evaluation.  I have placed a referral for you to follow-up with [facility].  Their office will contact you to schedule an appointment.  In the meantime continue to check your blood pressure at home and keep a log of home blood pressure readings.  At minimum I recommend checking your blood pressure once daily upon in the morning around the same time.  Start Amlodipine 5 mg at bedtime for  blood pressure control.  Recommend taking at night as medication has been known to cause dizziness and drowsiness. I have attached educational information regarding the medication.  Evidence supports cardiovascular outcomes improve in those who follow the mediterranean and or DASH diet. The Mediterranean diet emphasizes: Eating primarily plant-based foods, such as fruits and vegetables, whole grains, legumes and nuts. Replacing butter with healthy fats such as olive oil and canola oil. Using herbs and spices instead of salt to flavor foods The DASH diet eating plan is a diet rich in fruits, vegetables, low fat or nonfat dairy. It also includes mostly whole grains; lean meats, fish and poultry; nuts and beans. It is high fiber and low to moderate in fat. It is a plan that follows US guidelines for sodium content, along with vitamins and minerals.

## 2019-08-26 NOTE — ED Triage Notes (Signed)
Pt was home yesterday, and BP was 170/109, had a headache and felt bad.  Took this morning at work and it was 150/100.  Still felt bad, tired, run down.

## 2019-08-26 NOTE — ED Provider Notes (Signed)
[facility]    CSN: [id number] Arrival date & time: 08/26/19  0854      History   Chief Complaint Chief Complaint  Patient presents with  . Hypertension    HPI Paul Bowen is a 34 y.o. male.   HPI  Patient presents today with a concern for persistent elevated blood pressure readings with recurrent headaches over the last few days.  Patient's highest blood pressure reading at home has been 170/109.  He has had some episodes of dizziness along with chest pain intermittently over the last few days.  He is free of chest pain at present.  In review of EMR patient has had multiple visits to here at urgent care and his blood pressure readings have been greater than 140/100 consistently.  He has been intermittently followed at [facility] primary care and his blood pressure readings at his visits over the last 2 years there have also been greater than 130/80.  He has been seen previously at the ER for symptoms of chest pain which were found to be noncardiac related.  Patient denies any significant cardiac history in his immediate family.  He does endorse his mother having high blood pressure.  He denies any shortness of breath although remarks that he has a decrease in physical stamina which she attributed to getting older when engaging in activities outside with his children.  He denies any blurred vision, any slurring of speech, any unilateral weakness.  He is a non-smoker.   Past Medical History:  Diagnosis Date  . Hematuria     Patient Active Problem List   Diagnosis Date Noted  . Skin problem 06/11/2017  . Intermittent diarrhea 02/07/2017  . Dermatitis of foot 10/10/2015  . Fatigue 08/16/2015  . Nephrolithiasis 04/17/2015  . Epididymal cyst 05/03/2013    Past Surgical History:  Procedure Laterality Date  . TONSILLECTOMY    . WISDOM TOOTH EXTRACTION         Home Medications    Prior to Admission medications   Medication Sig Start Date End Date Taking?  Authorizing Provider  cephALEXin (KEFLEX) 500 MG capsule Take 1 capsule (500 mg total) by mouth 2 (two) times daily. 04/22/19   Noe Gens, PA-C  ciclopirox (LOPROX) 0.77 % cream Apply topically 2 (two) times daily. To affected skin 03/21/17   Emeterio Reeve, DO  clotrimazole (LOTRIMIN) 1 % cream Apply to affected area 2 times daily for up to 4 weeks 04/22/19   Noe Gens, PA-C  terbinafine (LAMISIL) 250 MG tablet Take 250 mg by mouth daily. 04/23/19   [provider]    Family History Family History  Problem Relation Age of Onset  . Hypertension Mother     Social History Social History   Tobacco Use  . Smoking status: Former Smoker    Quit date: 05/01/2007    Years since quitting: 12.3  . Smokeless tobacco: Never Used  Substance Use Topics  . Alcohol use: Not Currently  . Drug use: No     Allergies   Patient has no known allergies.   Review of Systems Review of Systems   Physical Exam Triage Vital Signs ED Triage Vitals  Enc Vitals Group     BP 08/26/19 0924 (!) 147/90     Pulse Rate 08/26/19 0924 71     Resp 08/26/19 0924 20     Temp 08/26/19 0924 98 F (36.7 C)     Temp Source 08/26/19 0924 Oral     SpO2 08/26/19  0924 99 %     Weight 08/26/19 0925 241 lb (109.3 kg)     Height 08/26/19 0925 5\' 9"  (1.753 m)     Head Circumference --      Peak Flow --      Pain Score 08/26/19 0924 0     Pain Loc --      Pain Edu? --      Excl. in GC? --    No data found.  Updated Vital Signs BP (!) 147/90 (BP Location: Right Arm)   Pulse 71   Temp 98 F (36.7 C) (Oral)   Resp 20   Ht 5\' 9"  (1.753 m)   Wt 241 lb (109.3 kg)   SpO2 99%   BMI 35.59 kg/m   Visual Acuity Right Eye Distance:   Left Eye Distance:   Bilateral Distance:    Right Eye Near:   Left Eye Near:    Bilateral Near:     Physical Exam   UC Treatments / Results  Labs (all labs ordered are listed, but only abnormal results are displayed) Labs Reviewed - No data to  display  EKG Normal sinus rhythm with possible LVH, negative for ST changes, 76 bpm  Radiology No results found.  Procedures Procedures (including critical care time)  Medications Ordered in UC Medications - No data to display  Initial Impression / Assessment and Plan / UC Course  I have reviewed the triage vital signs and the nursing notes.  Pertinent labs & imaging results that were available during my care of the patient were reviewed by me and considered in my medical decision making (see chart for details).  1. Essential hypertension, stable, uncontrolled In review of EMR previous visits here at urgent care and at prior visits with PCP patient has had longstanding hypertension for at least 2 years. Antihypertensive therapy initiated today with amlodipine 5 mg once daily.  Patient has been referred to cardiology and also encouraged to follow-up and schedule an appointment with his primary care provider within 2 to 3 weeks to evaluate effectiveness of current antihypertensive treatment.   Plan: BMP pending to evaluate renal functioning. Amlodipine  (NORVASC) 5 MG tablet Ambulatory referral to Cardiology complete cardiac work-up.   2. Elevated blood-pressure reading, without diagnosis of hypertension  Plan: Orthostatic vital signs- negative  Basic metabolic panel, pending  AmLODipine (NORVASC) 5 MG tablet  3. Atypical chest pain - Plan: EKG, NSR, LVH, possible early repolarization 76 BMP no significant ST changes  Ambulatory referral to Cardiology  4. LVH (left ventricular hypertrophy) seen on ECG -likely related to longstanding hypertension Plan: Referral placed for patient to be evaluated by cardiology given associated chest pain in the presence of longstanding high blood pressure.     Final Clinical Impressions(s) / UC Diagnoses   Final diagnoses:  Elevated blood-pressure reading, without diagnosis of hypertension  Atypical chest pain  LVH (left ventricular  hypertrophy) seen on ECG  Essential hypertension     Discharge Instructions     If chest pain, dizziness, or headache worsens and does not improve with rest go immediately to the emergency department for further work-up and evaluation.  I have placed a referral for you to follow-up with [facility] cardiology.  Their office will contact you to schedule an appointment.  In the meantime continue to check your blood pressure at home and keep a log of home blood pressure readings.  At minimum I recommend checking your blood pressure once daily upon in the morning  around the same time.  Start Amlodipine 5 mg at bedtime for  blood pressure control.  Recommend taking at night as medication has been known to cause dizziness and drowsiness. I have attached educational information regarding the medication.  Evidence supports cardiovascular outcomes improve in those who follow the mediterranean and or DASH diet. The Mediterranean diet emphasizes: Eating primarily plant-based foods, such as fruits and vegetables, whole grains, legumes and nuts. Replacing butter with healthy fats such as olive oil and canola oil. Using herbs and spices instead of salt to flavor foods The DASH diet eating plan is a diet rich in fruits, vegetables, low fat or nonfat dairy. It also includes mostly whole grains; lean meats, fish and poultry; nuts and beans. It is high fiber and low to moderate in fat. It is a plan that follows US guidelines for sodium content, along with vitamins and minerals.     ED Prescriptions    Medication Sig Dispense Auth. Provider   amLODipine (NORVASC) 5 MG tablet Take 1 tablet (5 mg total) by mouth daily. 30 tablet Bing Neighbors, FNP     PDMP not reviewed this encounter.   Bing Neighbors, FNP 08/26/19 1936

## 2019-08-30 ENCOUNTER — Other Ambulatory Visit: Payer: Self-pay

## 2019-08-30 ENCOUNTER — Ambulatory Visit (INDEPENDENT_AMBULATORY_CARE_PROVIDER_SITE_OTHER): Payer: Managed Care, Other (non HMO) | Admitting: Cardiology

## 2019-08-30 ENCOUNTER — Encounter: Payer: Self-pay | Admitting: Cardiology

## 2019-08-30 DIAGNOSIS — I10 Essential (primary) hypertension: Secondary | ICD-10-CM

## 2019-08-30 DIAGNOSIS — R0789 Other chest pain: Secondary | ICD-10-CM

## 2019-08-30 DIAGNOSIS — R0989 Other specified symptoms and signs involving the circulatory and respiratory systems: Secondary | ICD-10-CM

## 2019-08-30 DIAGNOSIS — R011 Cardiac murmur, unspecified: Secondary | ICD-10-CM

## 2019-08-30 HISTORY — DX: Essential (primary) hypertension: I10

## 2019-08-30 HISTORY — DX: Other specified symptoms and signs involving the circulatory and respiratory systems: R09.89

## 2019-08-30 HISTORY — DX: Other chest pain: R07.89

## 2019-08-30 HISTORY — DX: Cardiac murmur, unspecified: R01.1

## 2019-08-30 NOTE — Patient Instructions (Addendum)
Medication Instructions:  No medication changes *If you need a refill on your cardiac medications before your next appointment, please call your pharmacy*  Lab Work: No labs If you have labs (blood work) drawn today and your tests are completely normal, you will receive your results only by: Marland Kitchen MyChart Message (if you have MyChart) OR . A paper copy in the mail If you have any lab test that is abnormal or we need to change your treatment, we will call you to review the results.  Testing/Procedures: Your physician has requested that you have a renal artery ultrasound. During this test, an ultrasound is used to evaluate blood flow to the kidneys. Allow one hour for this exam. Do not eat after midnight the day before and avoid carbonated beverages. Take your medications as you usually do.   Your physician has requested that you have a lexiscan myoview. For further information please visit [email or web address] Please follow instruction sheet, as given. Your physician has requested that you have an echocardiogram. Echocardiography is a painless test that uses sound waves to create images of your heart. It provides your doctor with information about the size and shape of your heart and how well your heart's chambers and valves are working. This procedure takes approximately one hour. There are no restrictions for this procedure.     Follow-Up: At [facility], you and your health needs are our priority.  As part of our continuing mission to provide you with exceptional heart care, we have created designated Provider Care Teams.  These Care Teams include your primary Cardiologist (physician) and Advanced Practice Providers (APPs -  Physician Assistants and Nurse Practitioners) who all work together to provide you with the care you need, when you need it.  Your next appointment:   1 month(s)  The format for your next appointment:   In Person  Provider:   Belva Crome, MD  Other  Instructions   Echocardiogram An echocardiogram is a procedure that uses painless sound waves (ultrasound) to produce an image of the heart. Images from an echocardiogram can provide important information about:  Signs of coronary artery disease (CAD).  Aneurysm detection. An aneurysm is a weak or damaged part of an artery wall that bulges out from the normal force of blood pumping through the body.  Heart size and shape. Changes in the size or shape of the heart can be associated with certain conditions, including heart failure, aneurysm, and CAD.  Heart muscle function.  Heart valve function.  Signs of a past heart attack.  Fluid buildup around the heart.  Thickening of the heart muscle.  A tumor or infectious growth around the heart valves. Tell a health care provider about:  Any allergies you have.  All medicines you are taking, including vitamins, herbs, eye drops, creams, and over-the-counter medicines.  Any blood disorders you have.  Any surgeries you have had.  Any medical conditions you have.  Whether you are pregnant or may be pregnant. What are the risks? Generally, this is a safe procedure. However, problems may occur, including:  Allergic reaction to dye (contrast) that may be used during the procedure. What happens before the procedure? No specific preparation is needed. You may eat and drink normally. What happens during the procedure?   An IV tube may be inserted into one of your veins.  You may receive contrast through this tube. A contrast is an injection that improves the quality of the pictures from your heart.  A gel will be  applied to your chest.  A wand-like tool (transducer) will be moved over your chest. The gel will help to transmit the sound waves from the transducer.  The sound waves will harmlessly bounce off of your heart to allow the heart images to be captured in real-time motion. The images will be recorded on a computer. The  procedure may vary among health care providers and hospitals. What happens after the procedure?  You may return to your normal, everyday life, including diet, activities, and medicines, unless your health care provider tells you not to do that. Summary  An echocardiogram is a procedure that uses painless sound waves (ultrasound) to produce an image of the heart.  Images from an echocardiogram can provide important information about the size and shape of your heart, heart muscle function, heart valve function, and fluid buildup around your heart.  You do not need to do anything to prepare before this procedure. You may eat and drink normally.  After the echocardiogram is completed, you may return to your normal, everyday life, unless your health care provider tells you not to do that. This information is not intended to replace advice given to you by your health care provider. Make sure you discuss any questions you have with your health care provider. Document Revised: 10/22/2018 Document Reviewed: 08/03/2016 Elsevier Patient Education  2020 [employer].

## 2019-08-30 NOTE — Progress Notes (Signed)
Cardiology Office Note:    Date:  08/30/2019   ID:  Paul Bowen, DOB 1986/06/25, MRN [id number]  PCP:  Patient, No Pcp Per  Cardiologist:  Jenean Lindau, MD   Referring MD: Scot Jun, FNP    ASSESSMENT:    1. Essential hypertension   2. Cardiac murmur   3. Abdominal bruit   4. Chest tightness    PLAN:    In order of problems listed above:  1. Primary prevention stressed with the patient.  Importance of compliance with diet and medication stressed and he vocalized understanding. 2. Essential hypertension: Blood pressure is now stable on medications.  Diet was discussed salt intake issues were discussed and he promises to comply. 3. Cardiac murmur: Echocardiogram will be done to assess this further. 4. Chest tightness: Atypical in nature and we will do a Lexiscan sestamibi to assess this. 5. He will keep a good track of his pulse and blood pressure.  He also has an abdominal bruit and therefore I would like to assess him for renal arterial stenosis with renal artery ultrasound.  He is agreeable.  Patient had multiple questions which were answered to satisfaction.  He knows to go to the nearest emergency room for any concerning symptoms. 6. Patient will be seen in follow-up appointment in 1 month or earlier if the patient has any concerns    Medication Adjustments/Labs and Tests Ordered: Current medicines are reviewed at length with the patient today.  Concerns regarding medicines are outlined above.  No orders of the defined types were placed in this encounter.  No orders of the defined types were placed in this encounter.    History of Present Illness:    Paul Bowen is a 34 y.o. male who is being seen today for the evaluation of essential hypertension at the request of Scot Jun, FNP.  Patient is a pleasant 34 year old male.  He has past medical history that is not very significant.  Recently he found that his blood pressure was elevated and went to  urgent care center.  He was initiated on amlodipine and now it has been increased to 7.5 mg daily and his blood pressure is better.  Today he denies any chest pain orthopnea or PND.  At the time of my evaluation, the patient is alert awake oriented and in no distress.  He mentions of occasional chest tightness not related to exertion.  He is very active at his workplace.  He has no history of headache now or any dizziness or any passing out spells.  He is here for this evaluation.  Past Medical History:  Diagnosis Date  . Hematuria     Past Surgical History:  Procedure Laterality Date  . TONSILLECTOMY    . WISDOM TOOTH EXTRACTION      Current Medications: Current Meds  Medication Sig  . amLODipine (NORVASC) 5 MG tablet Take 1 tablet (5 mg total) by mouth daily.     Allergies:   Patient has no known allergies.   Social History   Socioeconomic History  . Marital status: Married    Spouse name: Not on file  . Number of children: Not on file  . Years of education: Not on file  . Highest education level: Not on file  Occupational History  . Not on file  Tobacco Use  . Smoking status: Former Smoker    Quit date: 05/01/2007    Years since quitting: 12.3  . Smokeless tobacco: Never Used  Substance and  Sexual Activity  . Alcohol use: Not Currently  . Drug use: No  . Sexual activity: Yes  Other Topics Concern  . Not on file  Social History Narrative  . Not on file   Social Determinants of Health   Financial Resource Strain:   . Difficulty of Paying Living Expenses: Not on file  Food Insecurity:   . Worried About Programme researcher, broadcasting/film/video in the Last Year: Not on file  . Ran Out of Food in the Last Year: Not on file  Transportation Needs:   . Lack of Transportation (Medical): Not on file  . Lack of Transportation (Non-Medical): Not on file  Physical Activity:   . Days of Exercise per Week: Not on file  . Minutes of Exercise per Session: Not on file  Stress:   . Feeling of  Stress : Not on file  Social Connections:   . Frequency of Communication with Friends and Family: Not on file  . Frequency of Social Gatherings with Friends and Family: Not on file  . Attends Religious Services: Not on file  . Active Member of Clubs or Organizations: Not on file  . Attends Banker Meetings: Not on file  . Marital Status: Not on file     Family History: The patient's family history includes Hypertension in his mother.  ROS:   Please see the history of present illness.    All other systems reviewed and are negative.  EKGs/Labs/Other Studies Reviewed:    The following studies were reviewed today: EKG reveals sinus rhythm nonspecific ST-T changes   Recent Labs: 08/26/2019: BUN 12; Creat 0.78; Potassium 4.9; Sodium 139  Recent Lipid Panel    Component Value Date/Time   CHOL 150 04/17/2015 1041   TRIG 134 04/17/2015 1041   HDL 35 (L) 04/17/2015 1041   CHOLHDL 4.3 04/17/2015 1041   VLDL 27 04/17/2015 1041   LDLCALC 88 04/17/2015 1041    Physical Exam:    VS:  BP (!) 136/100   Pulse 85   Ht 5\' 9"  (1.753 m)   Wt 239 lb (108.4 kg)   SpO2 98%   BMI 35.29 kg/m     Wt Readings from Last 3 Encounters:  08/30/19 239 lb (108.4 kg)  08/26/19 241 lb (109.3 kg)  04/22/19 239 lb (108.4 kg)     GEN: Patient is in no acute distress HEENT: Normal NECK: No JVD; No carotid bruits LYMPHATICS: No lymphadenopathy CARDIAC: S1 S2 regular, 2/6 systolic murmur at the apex. RESPIRATORY:  Clear to auscultation without rales, wheezing or rhonchi  ABDOMEN: Soft, non-tender, non-distended.  Bilateral soft bruit heard MUSCULOSKELETAL:  No edema; No deformity  SKIN: Warm and dry NEUROLOGIC:  Alert and oriented x 3 PSYCHIATRIC:  Normal affect    Signed, 06/22/19, MD  08/30/2019 11:10 AM    [facility]

## 2019-09-08 ENCOUNTER — Encounter (HOSPITAL_COMMUNITY): Payer: Managed Care, Other (non HMO)

## 2019-09-08 ENCOUNTER — Ambulatory Visit: Payer: Managed Care, Other (non HMO) | Admitting: Family Medicine

## 2019-09-09 ENCOUNTER — Other Ambulatory Visit: Payer: Self-pay

## 2019-09-09 ENCOUNTER — Ambulatory Visit (HOSPITAL_BASED_OUTPATIENT_CLINIC_OR_DEPARTMENT_OTHER)
Admission: RE | Admit: 2019-09-09 | Discharge: 2019-09-09 | Disposition: A | Discharge status: Home / Self Care | Payer: Managed Care, Other (non HMO) | Source: Ambulatory Visit | Attending: Cardiology | Admitting: Cardiology

## 2019-09-09 DIAGNOSIS — R0789 Other chest pain: Secondary | ICD-10-CM | POA: Diagnosis not present

## 2019-09-09 DIAGNOSIS — I10 Essential (primary) hypertension: Secondary | ICD-10-CM | POA: Diagnosis not present

## 2019-09-09 DIAGNOSIS — R0989 Other specified symptoms and signs involving the circulatory and respiratory systems: Secondary | ICD-10-CM

## 2019-09-09 DIAGNOSIS — R011 Cardiac murmur, unspecified: Secondary | ICD-10-CM | POA: Insufficient documentation

## 2019-09-09 NOTE — Progress Notes (Signed)
  Echocardiogram 2D Echocardiogram has been performed.  Sinda Du 09/09/2019, 9:37 AM

## 2019-09-09 NOTE — Progress Notes (Signed)
VAS US RENAL ARTERY LTD/UNILATERAL     Sinda Du 09/09/19

## 2019-09-27 ENCOUNTER — Other Ambulatory Visit: Payer: Self-pay

## 2019-09-27 ENCOUNTER — Encounter: Payer: Self-pay | Admitting: Cardiology

## 2019-09-27 ENCOUNTER — Ambulatory Visit: Payer: Managed Care, Other (non HMO) | Admitting: Cardiology

## 2019-09-27 VITALS — BP 120/86 | HR 80 | Ht 69.0 in | Wt 238.0 lb

## 2019-09-27 DIAGNOSIS — Z1329 Encounter for screening for other suspected endocrine disorder: Secondary | ICD-10-CM

## 2019-09-27 DIAGNOSIS — I10 Essential (primary) hypertension: Secondary | ICD-10-CM

## 2019-09-27 NOTE — Progress Notes (Signed)
Cardiology Office Note:    Date:  09/27/2019   ID:  Paul Bowen, DOB 10-18-1985, MRN [id number]  PCP:  Sunnie Nielsen, DO  Cardiologist:  Garwin Brothers, MD   Referring MD: No ref. provider found    ASSESSMENT:    1. Essential hypertension    PLAN:    In order of problems listed above:  1. I discussed my findings with the patient at extensive length and primary prevention stressed.  Importance of compliance with diet and medication stressed and he vocalized understanding.  In view of his fatigue we will do complete blood work including Chem-7 TSH and CBC. 2. Essential hypertension: Blood pressure is stable.  It is possible that his blood pressure is borderline and therefore I have stopped his losartan to see how he will feel. 3. Patient will be seen in follow-up appointment in 6 months or earlier if the patient has any concerns.  Importance of regular exercise stressed and he promises to comply.   Medication Adjustments/Labs and Tests Ordered: Current medicines are reviewed at length with the patient today.  Concerns regarding medicines are outlined above.  No orders of the defined types were placed in this encounter.  No orders of the defined types were placed in this encounter.    Chief Complaint  Patient presents with  . Follow-up     History of Present Illness:    Paul Bowen is a 34 y.o. male.  Patient has past medical history of essential hypertension.  He denies any problems at this time and takes care of activities of daily living.  No chest pain orthopnea or PND.  He complains of fatigue.  At the time of my evaluation, the patient is alert awake oriented and in no distress.  Past Medical History:  Diagnosis Date  . Hematuria     Past Surgical History:  Procedure Laterality Date  . TONSILLECTOMY    . WISDOM TOOTH EXTRACTION      Current Medications: Current Meds  Medication Sig  . amLODipine (NORVASC) 10 MG tablet Take 10 mg by mouth daily.  .  [DISCONTINUED] losartan (COZAAR) 25 MG tablet Take 25 mg by mouth 2 (two) times daily.     Allergies:   Patient has no known allergies.   Social History   Socioeconomic History  . Marital status: Married    Spouse name: Not on file  . Number of children: Not on file  . Years of education: Not on file  . Highest education level: Not on file  Occupational History  . Not on file  Tobacco Use  . Smoking status: Former Smoker    Quit date: 05/01/2007    Years since quitting: 12.4  . Smokeless tobacco: Never Used  Substance and Sexual Activity  . Alcohol use: Not Currently  . Drug use: No  . Sexual activity: Yes  Other Topics Concern  . Not on file  Social History Narrative  . Not on file   Social Determinants of Health   Financial Resource Strain:   . Difficulty of Paying Living Expenses:   Food Insecurity:   . Worried About Programme researcher, broadcasting/film/video in the Last Year:   . Barista in the Last Year:   Transportation Needs:   . Freight forwarder (Medical):   Marland Kitchen Lack of Transportation (Non-Medical):   Physical Activity:   . Days of Exercise per Week:   . Minutes of Exercise per Session:   Stress:   . Feeling of  Stress :   Social Connections:   . Frequency of Communication with Friends and Family:   . Frequency of Social Gatherings with Friends and Family:   . Attends Religious Services:   . Active Member of Clubs or Organizations:   . Attends Archivist Meetings:   Marland Kitchen Marital Status:      Family History: The patient's family history includes Hypertension in his mother.  ROS:   Please see the history of present illness.    All other systems reviewed and are negative.  EKGs/Labs/Other Studies Reviewed:    The following studies were reviewed today: IMPRESSIONS    1. Left ventricular ejection fraction, by estimation, is 55 to 60%. The  left ventricle has normal function. The left ventricle has no regional  wall motion abnormalities. Left  ventricular diastolic parameters were  normal.  2. Right ventricular systolic function is normal. The right ventricular  size is normal. There is normal pulmonary artery systolic pressure.  3. The mitral valve is normal in structure and function. No evidence of  mitral valve regurgitation. No evidence of mitral stenosis.  4. The aortic valve is normal in structure and function. Aortic valve  regurgitation is not visualized. No aortic stenosis is present.  5. The inferior vena cava is normal in size with greater than 50%  respiratory variability, suggesting right atrial pressure of 3 mmHg.   Renal:    Right: Normal size right kidney. No evidence of right renal artery     stenosis.  Left: Normal size of left kidney. No evidence of left renal artery     stenosis.  Mesenteric:  Normal Celiac artery and Superior Mesenteric artery findings.      Recent Labs: 08/26/2019: BUN 12; Creat 0.78; Potassium 4.9; Sodium 139  Recent Lipid Panel    Component Value Date/Time   CHOL 150 04/17/2015 1041   TRIG 134 04/17/2015 1041   HDL 35 (L) 04/17/2015 1041   CHOLHDL 4.3 04/17/2015 1041   VLDL 27 04/17/2015 1041   LDLCALC 88 04/17/2015 1041    Physical Exam:    VS:  BP 120/86   Pulse 80   Ht 5\' 9"  (1.753 m)   Wt 238 lb (108 kg)   SpO2 98%   BMI 35.15 kg/m     Wt Readings from Last 3 Encounters:  09/27/19 238 lb (108 kg)  08/30/19 239 lb (108.4 kg)  08/26/19 241 lb (109.3 kg)     GEN: Patient is in no acute distress HEENT: Normal NECK: No JVD; No carotid bruits LYMPHATICS: No lymphadenopathy CARDIAC: Hear sounds regular, 2/6 systolic murmur at the apex. RESPIRATORY:  Clear to auscultation without rales, wheezing or rhonchi  ABDOMEN: Soft, non-tender, non-distended MUSCULOSKELETAL:  No edema; No deformity  SKIN: Warm and dry NEUROLOGIC:  Alert and oriented x 3 PSYCHIATRIC:  Normal affect   Signed, Jenean Lindau, MD  09/27/2019 9:33 AM    [facility]

## 2019-09-27 NOTE — Patient Instructions (Signed)
Medication Instructions:  Your physician has recommended you make the following change in your medication:   Stop taking Losartan.  *If you need a refill on your cardiac medications before your next appointment, please call your pharmacy*   Lab Work: You had labs done today. If you have labs (blood work) drawn today and your tests are completely normal, you will receive your results only by: Marland Kitchen MyChart Message (if you have MyChart) OR . A paper copy in the mail If you have any lab test that is abnormal or we need to change your treatment, we will call you to review the results.   Testing/Procedures: None ordered   Follow-Up: At [facility], you and your health needs are our priority.  As part of our continuing mission to provide you with exceptional heart care, we have created designated Provider Care Teams.  These Care Teams include your primary Cardiologist (physician) and Advanced Practice Providers (APPs -  Physician Assistants and Nurse Practitioners) who all work together to provide you with the care you need, when you need it.  We recommend signing up for the patient portal called "MyChart".  Sign up information is provided on this After Visit Summary.  MyChart is used to connect with patients for Virtual Visits (Telemedicine).  Patients are able to view lab/test results, encounter notes, upcoming appointments, etc.  Non-urgent messages can be sent to your provider as well.   To learn more about what you can do with MyChart, go to [email or web address].    Your next appointment:   6 month(s)  The format for your next appointment:   In Person  Provider:   Belva Crome, MD   Other Instructions NA

## 2019-09-28 LAB — BASIC METABOLIC PANEL
BUN/Creatinine Ratio: 15 (ref 9–20)
BUN: 11 mg/dL (ref 6–20)
CO2: 21 mmol/L (ref 20–29)
Calcium: 9.3 mg/dL (ref 8.7–10.2)
Chloride: 103 mmol/L (ref 96–106)
Creatinine, Ser: 0.72 mg/dL — ABNORMAL LOW (ref 0.76–1.27)
GFR calc Af Amer: 140 mL/min/{1.73_m2} (ref >59)
GFR calc non Af Amer: 121 mL/min/{1.73_m2} (ref >59)
Glucose: 91 mg/dL (ref 65–99)
Potassium: 4.4 mmol/L (ref 3.5–5.2)
Sodium: 138 mmol/L (ref 134–144)

## 2019-09-28 LAB — CBC WITH DIFFERENTIAL/PLATELET
Basophils Absolute: 0.1 10*3/uL (ref 0.0–0.2)
Basos: 1 %
EOS (ABSOLUTE): 0.1 10*3/uL (ref 0.0–0.4)
Eos: 2 %
Hematocrit: 44.6 % (ref 37.5–51.0)
Hemoglobin: 15.7 g/dL (ref 13.0–17.7)
Immature Grans (Abs): 0 10*3/uL (ref 0.0–0.1)
Immature Granulocytes: 0 %
Lymphocytes Absolute: 2.2 10*3/uL (ref 0.7–3.1)
Lymphs: 33 %
MCH: 30.3 pg (ref 26.6–33.0)
MCHC: 35.2 g/dL (ref 31.5–35.7)
MCV: 86 fL (ref 79–97)
Monocytes Absolute: 0.7 10*3/uL (ref 0.1–0.9)
Monocytes: 10 %
Neutrophils Absolute: 3.6 10*3/uL (ref 1.4–7.0)
Neutrophils: 54 %
Platelets: 227 10*3/uL (ref 150–450)
RBC: 5.18 x10E6/uL (ref 4.14–5.80)
RDW: 12.1 % (ref 11.6–15.4)
WBC: 6.7 10*3/uL (ref 3.4–10.8)

## 2019-09-28 LAB — TSH: TSH: 1.2 u[IU]/mL (ref 0.450–4.500)

## 2020-04-05 ENCOUNTER — Encounter: Payer: Self-pay | Admitting: Cardiology

## 2020-04-05 ENCOUNTER — Ambulatory Visit: Payer: Managed Care, Other (non HMO) | Admitting: Cardiology

## 2020-04-05 ENCOUNTER — Ambulatory Visit (INDEPENDENT_AMBULATORY_CARE_PROVIDER_SITE_OTHER): Payer: Managed Care, Other (non HMO) | Admitting: Cardiology

## 2020-04-05 ENCOUNTER — Other Ambulatory Visit: Payer: Self-pay

## 2020-04-05 VITALS — BP 134/92 | HR 76 | Ht 70.5 in | Wt 245.1 lb

## 2020-04-05 DIAGNOSIS — I10 Essential (primary) hypertension: Secondary | ICD-10-CM

## 2020-04-05 MED ORDER — AMLODIPINE BESYLATE 5 MG PO TABS: 5.0000 mg | ORAL_TABLET | Freq: Every day | ORAL | 3 refills | Status: DC

## 2020-04-05 NOTE — Patient Instructions (Addendum)
Medication Instructions:  Your physician has recommended you make the following change in your medication:   Stop Losartan  Start Amlodipine 5 mg daily.  *If you need a refill on your cardiac medications before your next appointment, please call your pharmacy*   Lab Work: Your physician recommends that you have labs done in the office today. Your test included  basic metabolic panel, complete blood count, TSH, liver function and lipids.  If you have labs (blood work) drawn today and your tests are completely normal, you will receive your results only by: Marland Kitchen MyChart Message (if you have MyChart) OR . A paper copy in the mail If you have any lab test that is abnormal or we need to change your treatment, we will call you to review the results.   Testing/Procedures: None ordered   Follow-Up: At [facility], you and your health needs are our priority.  As part of our continuing mission to provide you with exceptional heart care, we have created designated Provider Care Teams.  These Care Teams include your primary Cardiologist (physician) and Advanced Practice Providers (APPs -  Physician Assistants and Nurse Practitioners) who all work together to provide you with the care you need, when you need it.  We recommend signing up for the patient portal called "MyChart".  Sign up information is provided on this After Visit Summary.  MyChart is used to connect with patients for Virtual Visits (Telemedicine).  Patients are able to view lab/test results, encounter notes, upcoming appointments, etc.  Non-urgent messages can be sent to your provider as well.   To learn more about what you can do with MyChart, go to [email or web address].    Your next appointment:   6 month(s)  The format for your next appointment:   In Person  Provider:   Belva Crome, MD   Other Instructions NA

## 2020-04-05 NOTE — Progress Notes (Signed)
Cardiology Office Note:    Date:  04/05/2020   ID:  Paul Bowen, DOB July 15, 1986, MRN [id number]  PCP:  Sunnie Nielsen, DO  Cardiologist:  Garwin Brothers, MD   Referring MD: Sunnie Nielsen, DO    ASSESSMENT:    1. Essential hypertension    PLAN:    In order of problems listed above:  1. Primary prevention stressed with the patient.  Importance of compliance with diet medication stressed and vocalized understanding.  He is overweight.  Weight reduction was stressed and diet was emphasized. 2. Essential hypertension: In view of his complaints and issues I change his losartan to amlodipine 5 mg daily.  Benefits and risks explained and he vocalized understanding.  Lifestyle modification was emphasized.  He was advised to walk at least half an hour a day 5 days a week and he promises to do so. 3. He is fasting and will have complete blood work today.  He requested vitamin D check and we will do it for him and send it to his primary care physician. 4. Patient will be seen in follow-up appointment in 6 months or earlier if the patient has any concerns    Medication Adjustments/Labs and Tests Ordered: Current medicines are reviewed at length with the patient today.  Concerns regarding medicines are outlined above.  Orders Placed This Encounter  Procedures  . Basic metabolic panel  . CBC with Differential/Platelet  . Hepatic function panel  . Lipid panel  . TSH  . VITAMIN D 25 Hydroxy (Vit-D Deficiency, Fractures)   Meds ordered this encounter  Medications  . amLODipine (NORVASC) 5 MG tablet    Sig: Take 1 tablet (5 mg total) by mouth daily.    Dispense:  180 tablet    Refill:  3     No chief complaint on file.    History of Present Illness:    Paul Bowen is a 34 y.o. male.  Patient has past medical history of essential hypertension.  He denies any problems at this time and takes care of activities of daily living.  No chest pain orthopnea or PND.  He mentions to  me that he has brain fog-like issues with losartan so he cut the dose into half which is 50 mg and still has the same issues.  No chest pain orthopnea or PND.  At the time of my evaluation, the patient is alert awake oriented and in no distress.  Past Medical History:  Diagnosis Date  . Hematuria     Past Surgical History:  Procedure Laterality Date  . TONSILLECTOMY    . WISDOM TOOTH EXTRACTION      Current Medications: Current Meds  Medication Sig  . citalopram (CELEXA) 10 MG tablet Take 10 mg by mouth daily.  . [DISCONTINUED] losartan (COZAAR) 50 MG tablet Take 50 mg by mouth daily.     Allergies:   Patient has no known allergies.   Social History   Socioeconomic History  . Marital status: Married    Spouse name: Not on file  . Number of children: Not on file  . Years of education: Not on file  . Highest education level: Not on file  Occupational History  . Not on file  Tobacco Use  . Smoking status: Former Smoker    Quit date: 05/01/2007    Years since quitting: 12.9  . Smokeless tobacco: Never Used  Vaping Use  . Vaping Use: Never used  Substance and Sexual Activity  . Alcohol use: Not  Currently  . Drug use: No  . Sexual activity: Yes  Other Topics Concern  . Not on file  Social History Narrative  . Not on file   Social Determinants of Health   Financial Resource Strain:   . Difficulty of Paying Living Expenses: Not on file  Food Insecurity:   . Worried About Programme researcher, broadcasting/film/video in the Last Year: Not on file  . Ran Out of Food in the Last Year: Not on file  Transportation Needs:   . Lack of Transportation (Medical): Not on file  . Lack of Transportation (Non-Medical): Not on file  Physical Activity:   . Days of Exercise per Week: Not on file  . Minutes of Exercise per Session: Not on file  Stress:   . Feeling of Stress : Not on file  Social Connections:   . Frequency of Communication with Friends and Family: Not on file  . Frequency of Social  Gatherings with Friends and Family: Not on file  . Attends Religious Services: Not on file  . Active Member of Clubs or Organizations: Not on file  . Attends Banker Meetings: Not on file  . Marital Status: Not on file     Family History: The patient's family history includes Hypertension in his mother.  ROS:   Please see the history of present illness.    All other systems reviewed and are negative.  EKGs/Labs/Other Studies Reviewed:    The following studies were reviewed today: Summary:  Largest Aortic Diameter: 2.2 cm    Renal:    Right: Normal size right kidney. No evidence of right renal artery     stenosis.  Left: Normal size of left kidney. No evidence of left renal artery     stenosis.  Mesenteric:  Normal Celiac artery and Superior Mesenteric artery findings.    *See table(s) above for measurements and observations.    Diagnosing physician: Gypsy Balsam MD    Electronically signed by Gypsy Balsam MD on 09/09/2019 at 6:36:21 PM.    Recent Labs: 09/27/2019: BUN 11; Creatinine, Ser 0.72; Hemoglobin 15.7; Platelets 227; Potassium 4.4; Sodium 138; TSH 1.200  Recent Lipid Panel    Component Value Date/Time   CHOL 150 04/17/2015 1041   TRIG 134 04/17/2015 1041   HDL 35 (L) 04/17/2015 1041   CHOLHDL 4.3 04/17/2015 1041   VLDL 27 04/17/2015 1041   LDLCALC 88 04/17/2015 1041    Physical Exam:    VS:  BP (!) 134/92   Pulse 76   Ht 5' 10.5" (1.791 m)   Wt 245 lb 1.3 oz (111.2 kg)   SpO2 98%   BMI 34.67 kg/m     Wt Readings from Last 3 Encounters:  04/05/20 245 lb 1.3 oz (111.2 kg)  09/27/19 238 lb (108 kg)  08/30/19 239 lb (108.4 kg)     GEN: Patient is in no acute distress HEENT: Normal NECK: No JVD; No carotid bruits LYMPHATICS: No lymphadenopathy CARDIAC: Hear sounds regular, 2/6 systolic murmur at the apex. RESPIRATORY:  Clear to auscultation without rales, wheezing or rhonchi  ABDOMEN: Soft, non-tender,  non-distended MUSCULOSKELETAL:  No edema; No deformity  SKIN: Warm and dry NEUROLOGIC:  Alert and oriented x 3 PSYCHIATRIC:  Normal affect   Signed, Garwin Brothers, MD  04/05/2020 8:36 AM    [facility]

## 2020-04-06 LAB — HEPATIC FUNCTION PANEL
ALT: 36 IU/L (ref 0–44)
AST: 21 IU/L (ref 0–40)
Albumin: 4.6 g/dL (ref 4.0–5.0)
Alkaline Phosphatase: 55 IU/L (ref 44–121)
Bilirubin Total: 0.4 mg/dL (ref 0.0–1.2)
Bilirubin, Direct: 0.13 mg/dL (ref 0.00–0.40)
Total Protein: 7 g/dL (ref 6.0–8.5)

## 2020-04-06 LAB — CBC WITH DIFFERENTIAL/PLATELET
Basophils Absolute: 0.1 10*3/uL (ref 0.0–0.2)
Basos: 1 %
EOS (ABSOLUTE): 0.1 10*3/uL (ref 0.0–0.4)
Eos: 2 %
Hematocrit: 45.5 % (ref 37.5–51.0)
Hemoglobin: 15.3 g/dL (ref 13.0–17.7)
Immature Grans (Abs): 0 10*3/uL (ref 0.0–0.1)
Immature Granulocytes: 0 %
Lymphocytes Absolute: 2.7 10*3/uL (ref 0.7–3.1)
Lymphs: 41 %
MCH: 29.7 pg (ref 26.6–33.0)
MCHC: 33.6 g/dL (ref 31.5–35.7)
MCV: 88 fL (ref 79–97)
Monocytes Absolute: 0.7 10*3/uL (ref 0.1–0.9)
Monocytes: 11 %
Neutrophils Absolute: 3 10*3/uL (ref 1.4–7.0)
Neutrophils: 45 %
Platelets: 198 10*3/uL (ref 150–450)
RBC: 5.16 x10E6/uL (ref 4.14–5.80)
RDW: 12.2 % (ref 11.6–15.4)
WBC: 6.6 10*3/uL (ref 3.4–10.8)

## 2020-04-06 LAB — BASIC METABOLIC PANEL
BUN/Creatinine Ratio: 14 (ref 9–20)
BUN: 11 mg/dL (ref 6–20)
CO2: 23 mmol/L (ref 20–29)
Calcium: 9.5 mg/dL (ref 8.7–10.2)
Chloride: 104 mmol/L (ref 96–106)
Creatinine, Ser: 0.76 mg/dL (ref 0.76–1.27)
GFR calc Af Amer: 137 mL/min/{1.73_m2} (ref >59)
GFR calc non Af Amer: 118 mL/min/{1.73_m2} (ref >59)
Glucose: 94 mg/dL (ref 65–99)
Potassium: 4.5 mmol/L (ref 3.5–5.2)
Sodium: 139 mmol/L (ref 134–144)

## 2020-04-06 LAB — LIPID PANEL
Chol/HDL Ratio: 3.6 ratio (ref 0.0–5.0)
Cholesterol, Total: 146 mg/dL (ref 100–199)
HDL: 41 mg/dL (ref >39)
LDL Chol Calc (NIH): 90 mg/dL (ref 0–99)
Triglycerides: 78 mg/dL (ref 0–149)
VLDL Cholesterol Cal: 15 mg/dL (ref 5–40)

## 2020-04-06 LAB — TSH: TSH: 1.21 u[IU]/mL (ref 0.450–4.500)

## 2020-04-06 LAB — VITAMIN D 25 HYDROXY (VIT D DEFICIENCY, FRACTURES): Vit D, 25-Hydroxy: 25.7 ng/mL — ABNORMAL LOW (ref 30.0–100.0)

## 2020-04-13 ENCOUNTER — Telehealth: Payer: Self-pay | Admitting: Cardiology

## 2020-04-13 DIAGNOSIS — I10 Essential (primary) hypertension: Secondary | ICD-10-CM

## 2020-04-13 DIAGNOSIS — Z79899 Other long term (current) drug therapy: Secondary | ICD-10-CM

## 2020-04-13 NOTE — Telephone Encounter (Signed)
New Message   Pt c/o medication issue:  1. Name of Medication: amLODipine (NORVASC) 5 MG tablet  2. How are you currently taking this medication (dosage and times per day)? 5 mg 1x daily   3. Are you having a reaction (difficulty breathing--STAT)? No   4. What is your medication issue? Pt is calling and says he is experiencing a rash on his Forehead, nose, cheeks and into ears.    Please call and leave a message or message patient on mychart, he says he is working

## 2020-04-14 MED ORDER — HYDROCHLOROTHIAZIDE 25 MG PO TABS: 25.0000 mg | ORAL_TABLET | Freq: Every day | ORAL | 6 refills | Status: DC

## 2020-04-14 NOTE — Addendum Note (Signed)
Addended by: Eleonore Chiquito on: 04/14/2020 09:41 AM   Modules accepted: Orders

## 2020-04-14 NOTE — Telephone Encounter (Signed)
Stop amlodipine.  He can use any over-the-counter antihistamine like Zyrtec and not Zyrtec-D or any other medicine.  Stop amlodipine start hydrochlorothiazide 25 mg daily.  Encourage potassium supplementation and diet.  He will take this medicine every morning.  Chem-7 in 8 days.  Let him check with the pharmacist if this medicine hydrochlorothiazide has any issues with patients who cannot tolerate amlodipine.

## 2020-04-20 ENCOUNTER — Telehealth: Payer: Self-pay | Admitting: Cardiology

## 2020-04-20 DIAGNOSIS — I10 Essential (primary) hypertension: Secondary | ICD-10-CM

## 2020-04-20 MED ORDER — LISINOPRIL 10 MG PO TABS: 10.0000 mg | ORAL_TABLET | Freq: Every day | ORAL | 3 refills | Status: DC

## 2020-04-20 NOTE — Telephone Encounter (Signed)
Spoke to the patient just now and let him know Dr. Revankar's recommendations. He verbalizes understanding and thanks me for the call back.  °

## 2020-04-20 NOTE — Telephone Encounter (Signed)
Lisinopril 10 mg daily.  Keep a track of blood pressure for 1 week and come back for Chem-7 in 1 week and give [place] the log of her blood pressure.

## 2020-04-20 NOTE — Telephone Encounter (Signed)
New message    Pt c/o medication issue:  1. Name of Medication: amlodipine  2. How are you currently taking this medication (dosage and times per day)? 5mg  daily  3. Are you having a reaction (difficulty breathing--STAT)? Rash on face  4. What is your medication issue? Patient started medication recently and developed a rash on his face and ears.  Patient stopped medication last Friday and rash has now gone away.  Patient took bp while I was on the phone with him and it was 144/84 HR 78.  Calling to see what is the next step.  He has hurt his back and is unable to come in office at this time.  Please advise.

## 2020-09-25 DIAGNOSIS — R319 Hematuria, unspecified: Secondary | ICD-10-CM | POA: Insufficient documentation

## 2020-09-26 ENCOUNTER — Ambulatory Visit: Payer: Managed Care, Other (non HMO) | Admitting: Cardiology

## 2020-09-26 ENCOUNTER — Other Ambulatory Visit: Payer: Self-pay

## 2020-09-26 ENCOUNTER — Encounter: Payer: Self-pay | Admitting: Cardiology

## 2020-09-26 VITALS — BP 128/82 | HR 69 | Ht 69.6 in | Wt 241.0 lb

## 2020-09-26 DIAGNOSIS — E669 Obesity, unspecified: Secondary | ICD-10-CM

## 2020-09-26 DIAGNOSIS — I10 Essential (primary) hypertension: Secondary | ICD-10-CM

## 2020-09-26 DIAGNOSIS — E66811 Obesity, class 1: Secondary | ICD-10-CM | POA: Insufficient documentation

## 2020-09-26 HISTORY — DX: Obesity, unspecified: E66.9

## 2020-09-26 NOTE — Patient Instructions (Signed)

## 2020-09-26 NOTE — Progress Notes (Signed)
Cardiology Office Note:    Date:  09/26/2020   ID:  Paul Bowen, DOB 21-Apr-1986, MRN [id number]  PCP:  Patient, No Pcp Per  Cardiologist:  Garwin Brothers, MD   Referring MD: Sunnie Nielsen, DO    ASSESSMENT:    1. Essential hypertension   2. Obesity (BMI 30.0-34.9)    PLAN:    In order of problems listed above:  1. Primary prevention stressed with the patient.  Importance of compliance with diet medication stressed any vocalized understanding.  He was advised to walk at least half an hour a day on a regular basis and he promises to do so. 2. Essential hypertension: Blood pressure stable and he is happy about it.  Lifestyle modification, diet and salt intake issues were discussed and he promises to comply. 3. Obesity: Risks of obesity explained.  He has changed his job now and it is less stressful and he tells me that he is going to be more intentional as to his health predominantly diet and exercise. 4. Patient will be seen in follow-up appointment in 6 months or earlier if the patient has any concerns 5. I reviewed his blood work done recently and wanted to be fine.  His lipids from last evaluation was fine.   Medication Adjustments/Labs and Tests Ordered: Current medicines are reviewed at length with the patient today.  Concerns regarding medicines are outlined above.  Orders Placed This Encounter  Procedures  . EKG 12-Lead   No orders of the defined types were placed in this encounter.    No chief complaint on file.    History of Present Illness:    Paul Bowen is a 35 y.o. male.  Patient has past medical history of essential hypertension and obesity.  He leads a sedentary lifestyle.  He denies any chest pain orthopnea or PND.  He takes care of activities of daily living.  He is here for routine follow-up.  His medications are not working well for his blood pressure.  Is taking good care of his diet.  At the time of my evaluation, the patient is alert awake  oriented and in no distress.  Past Medical History:  Diagnosis Date  . Abdominal bruit 08/30/2019  . Cardiac murmur 08/30/2019  . Chest tightness 08/30/2019  . Dermatitis of foot 10/10/2015   Dr. Stefanie Libel with [facility]: clobetasol cream and prednisone March 2017   . Epididymal cyst 05/03/2013   2.1cm right testicle October 2014   . Essential hypertension 08/30/2019  . Fatigue 08/16/2015  . Hematuria   . 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 Consider stool study or GI referral if persists despite conservative tx  . Nephrolithiasis 04/17/2015  . Skin problem 06/11/2017   Following with dermatology, Lurena Joiner, PA-C at [facility] dermatology, 05/26/2017: Family history of malignant melanoma, evidence of chronic actinic damage, solar: 2 to knees, melanocytic nevi. Seborrheic dermatitis of scalp and beard, prescribed ketoconazole 2% shampoo, fluocinonide 0.05% solution. Follow up annually    Past Surgical History:  Procedure Laterality Date  . TONSILLECTOMY    . WISDOM TOOTH EXTRACTION      Current Medications: Current Meds  Medication Sig  . lisinopril (ZESTRIL) 10 MG tablet Take 10 mg by mouth daily.     Allergies:   Amlodipine   Social History   Socioeconomic History  . Marital status: Married    Spouse name: Not on file  . Number of children: Not on file  .  Years of education: Not on file  . Highest education level: Not on file  Occupational History  . Not on file  Tobacco Use  . Smoking status: Former Smoker    Quit date: 05/01/2007    Years since quitting: 13.4  . Smokeless tobacco: Never Used  Vaping Use  . Vaping Use: Never used  Substance and Sexual Activity  . Alcohol use: Not Currently  . Drug use: No  . Sexual activity: Yes  Other Topics Concern  . Not on file  Social History Narrative  . Not on file   Social Determinants of Health   Financial Resource Strain: Not on file   Food Insecurity: Not on file  Transportation Needs: Not on file  Physical Activity: Not on file  Stress: Not on file  Social Connections: Not on file     Family History: The patient's family history includes Hypertension in his mother.  ROS:   Please see the history of present illness.    All other systems reviewed and are negative.  EKGs/Labs/Other Studies Reviewed:    The following studies were reviewed today: IMPRESSIONS    1. Left ventricular ejection fraction, by estimation, is 55 to 60%. The  left ventricle has normal function. The left ventricle has no regional  wall motion abnormalities. Left ventricular diastolic parameters were  normal.  2. Right ventricular systolic function is normal. The right ventricular  size is normal. There is normal pulmonary artery systolic pressure.  3. The mitral valve is normal in structure and function. No evidence of  mitral valve regurgitation. No evidence of mitral stenosis.  4. The aortic valve is normal in structure and function. Aortic valve  regurgitation is not visualized. No aortic stenosis is present.  5. The inferior vena cava is normal in size with greater than 50%  respiratory variability, suggesting right atrial pressure of 3 mmHg.    Recent Labs: 04/05/2020: ALT 36; BUN 11; Creatinine, Ser 0.76; Hemoglobin 15.3; Platelets 198; Potassium 4.5; Sodium 139; TSH 1.210  Recent Lipid Panel    Component Value Date/Time   CHOL 146 04/05/2020 0842   TRIG 78 04/05/2020 0842   HDL 41 04/05/2020 0842   CHOLHDL 3.6 04/05/2020 0842   CHOLHDL 4.3 04/17/2015 1041   VLDL 27 04/17/2015 1041   LDLCALC 90 04/05/2020 0842    Physical Exam:    VS:  BP 128/82   Pulse 69   Ht 5' 9.6" (1.768 m)   Wt 241 lb 0.6 oz (109.3 kg)   SpO2 99%   BMI 34.98 kg/m     Wt Readings from Last 3 Encounters:  09/26/20 241 lb 0.6 oz (109.3 kg)  04/05/20 245 lb 1.3 oz (111.2 kg)  09/27/19 238 lb (108 kg)     GEN: Patient is in no acute  distress HEENT: Normal NECK: No JVD; No carotid bruits LYMPHATICS: No lymphadenopathy CARDIAC: Hear sounds regular, 2/6 systolic murmur at the apex. RESPIRATORY:  Clear to auscultation without rales, wheezing or rhonchi  ABDOMEN: Soft, non-tender, non-distended MUSCULOSKELETAL:  No edema; No deformity  SKIN: Warm and dry NEUROLOGIC:  Alert and oriented x 3 PSYCHIATRIC:  Normal affect   Signed, Garwin Brothers, MD  09/26/2020 8:56 AM    [facility]

## 2020-09-28 IMAGING — DX DG FOOT COMPLETE 3+V*L*
3 series · 3 of 3 positions shown · non-contrast
Comparison: None.

CLINICAL DATA: LEFT foot pain, swelling and redness for 1 week.

EXAM:
LEFT FOOT - COMPLETE 3+ VIEW

[foot ap]
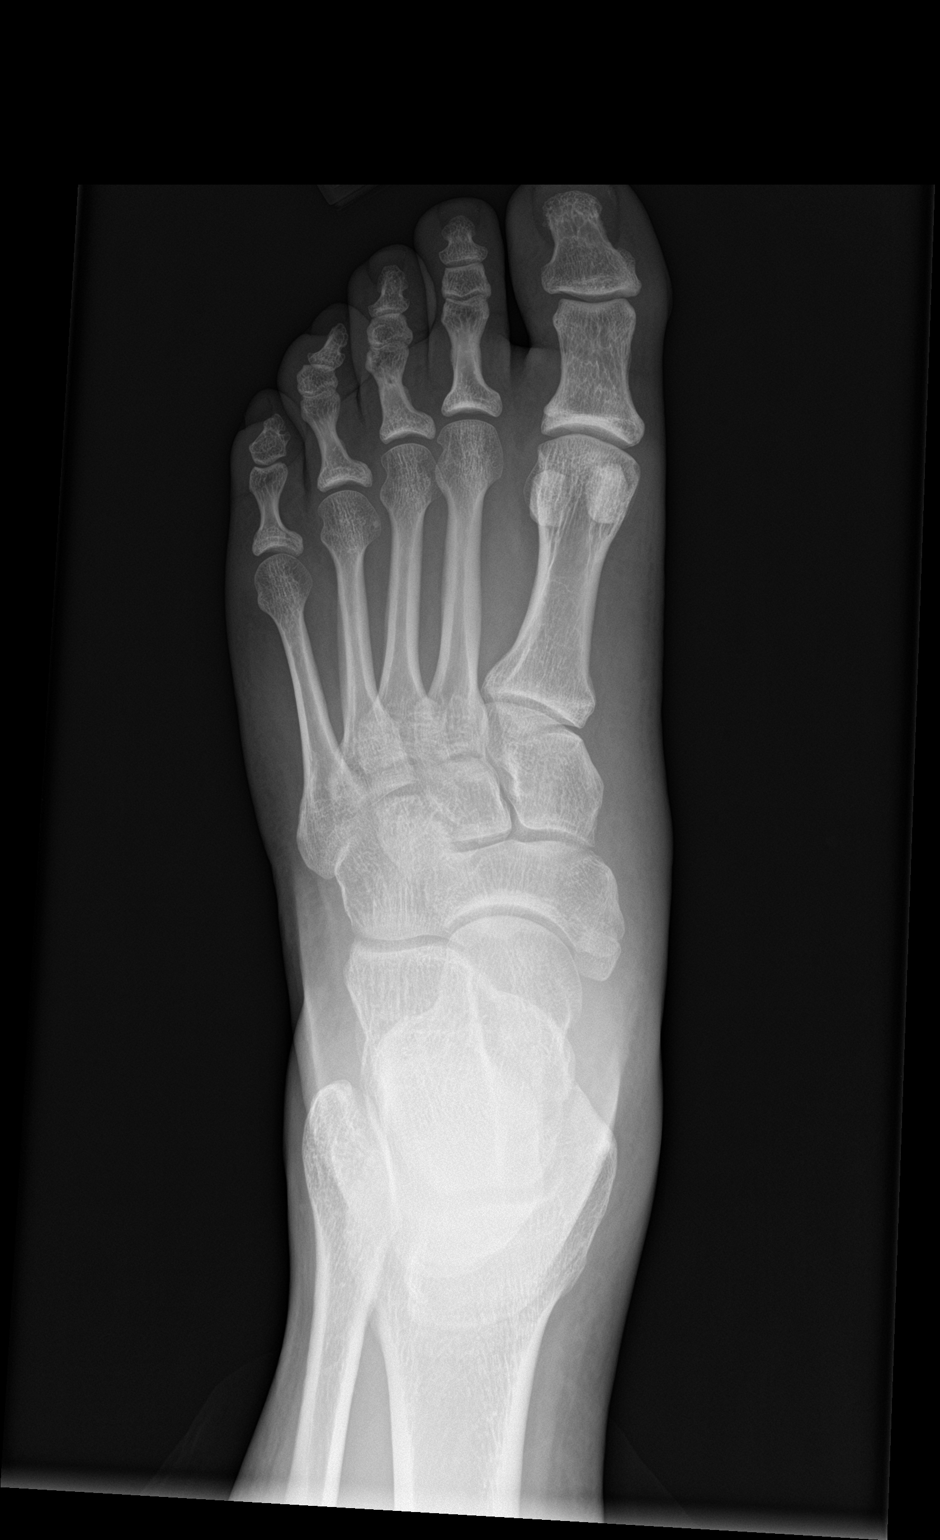

[foot obl]
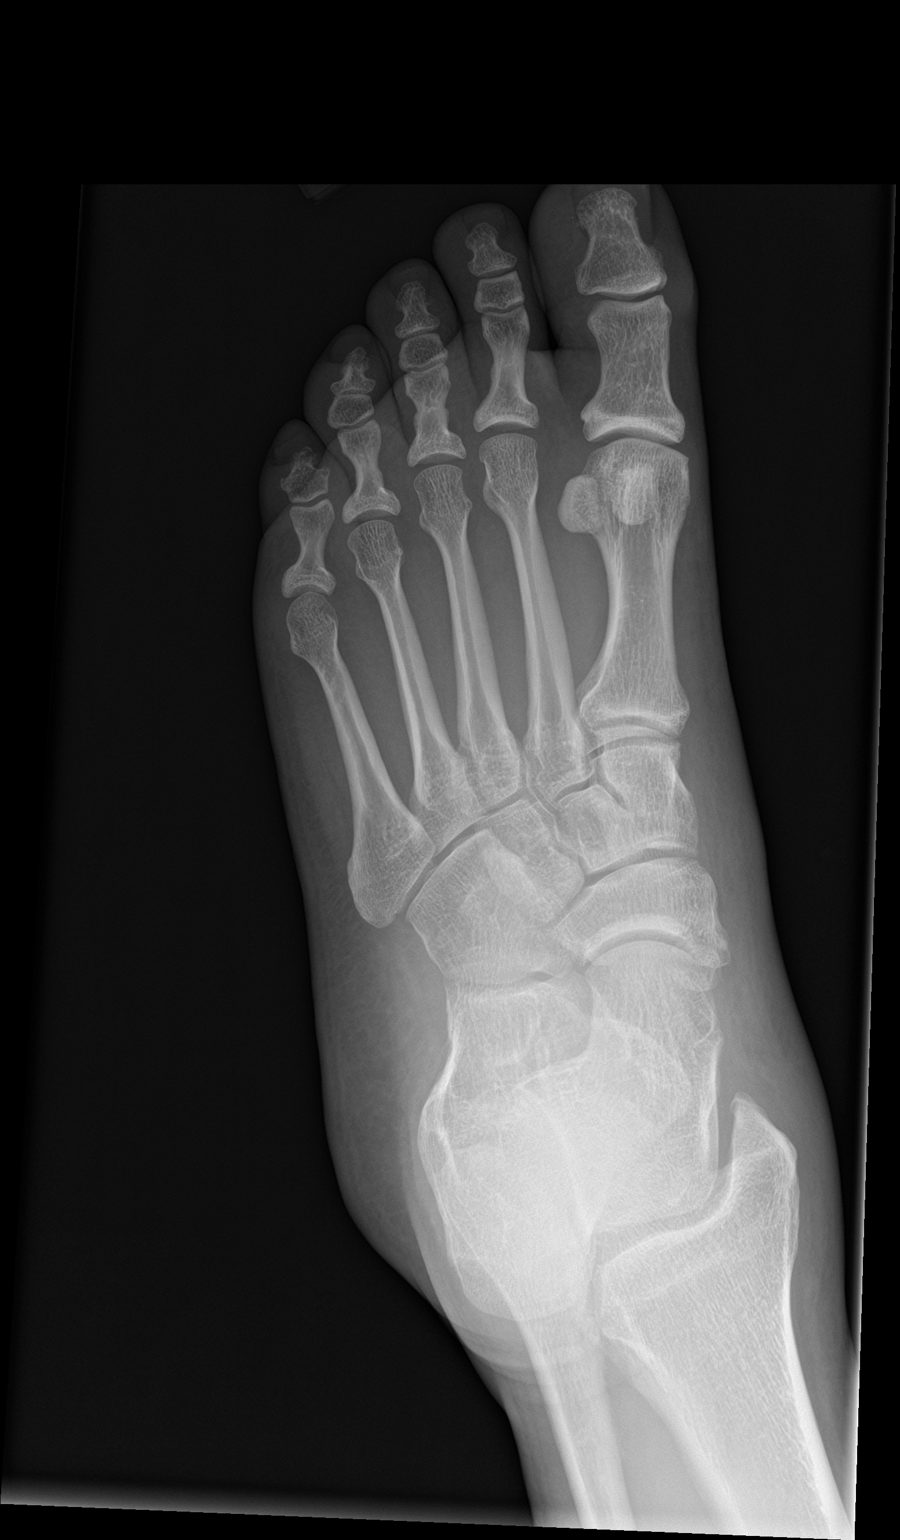

[foot lat]
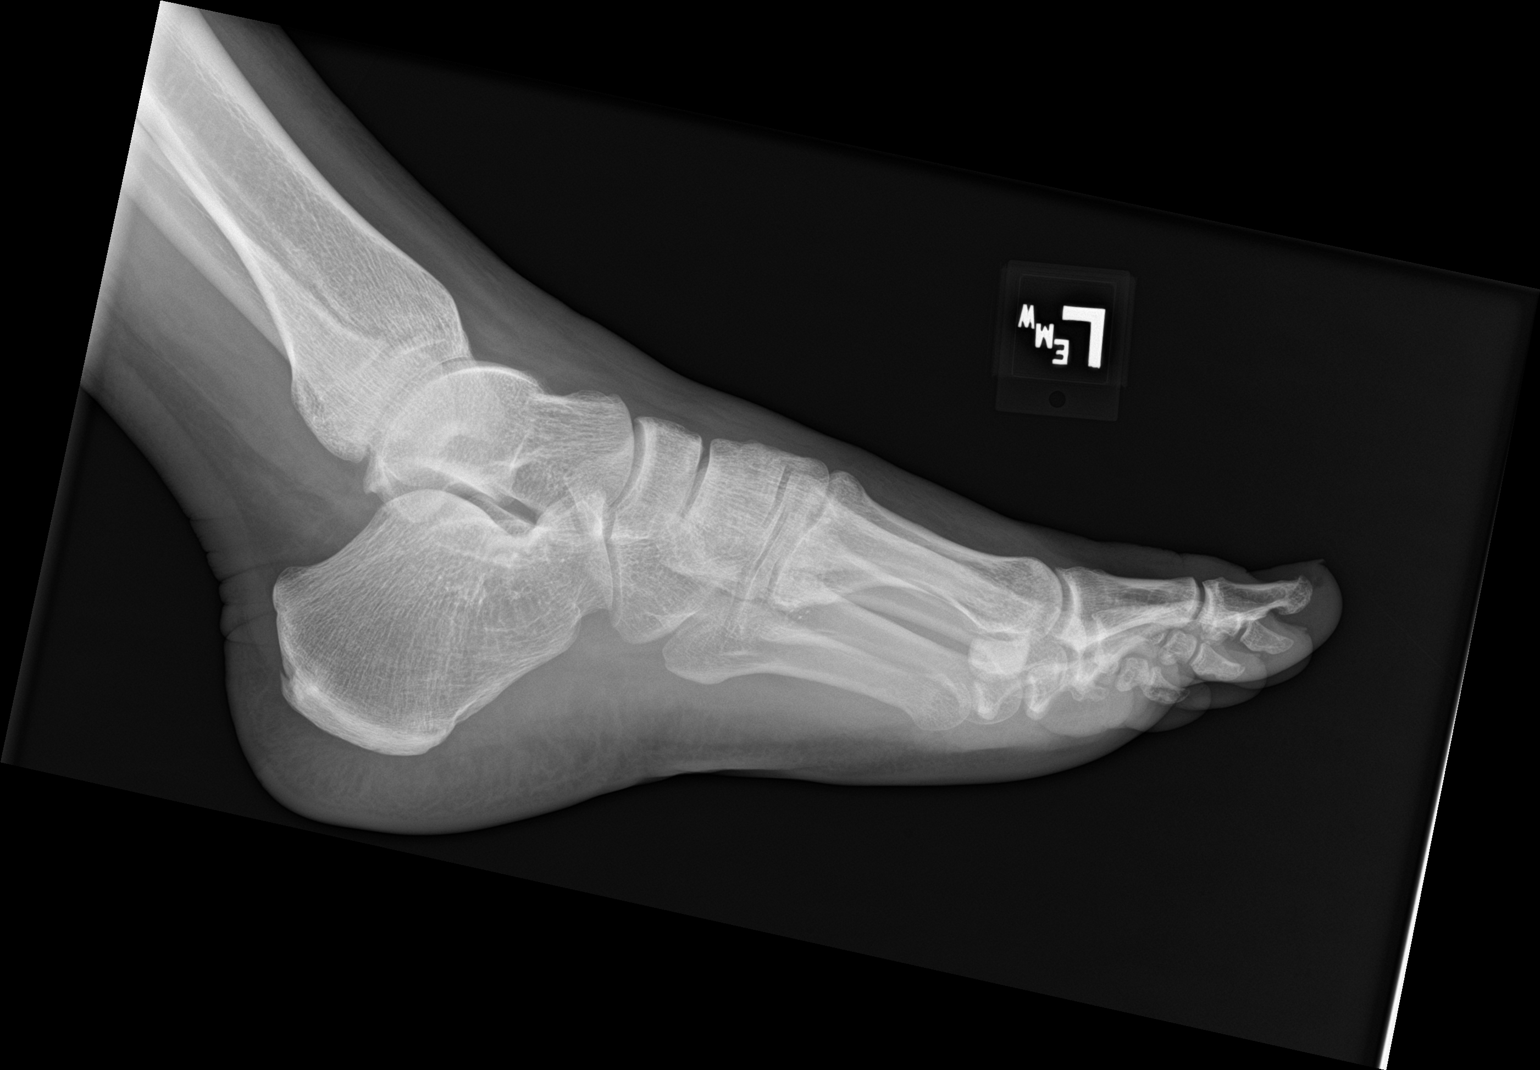

[3 of 3 positions shown; findings below may reference images not displayed]

FINDINGS: No fracture, subluxation or dislocation identified.

No radiographic evidence of osteomyelitis noted.

MEDIAL soft tissue swelling is noted.

No radiopaque foreign bodies are identified.
IMPRESSION: Soft tissue swelling without acute bony abnormality.

## 2021-03-29 ENCOUNTER — Ambulatory Visit: Payer: Managed Care, Other (non HMO) | Admitting: Cardiology

## 2021-04-24 ENCOUNTER — Other Ambulatory Visit: Payer: Self-pay | Admitting: Cardiology

## 2021-05-29 ENCOUNTER — Ambulatory Visit: Payer: Managed Care, Other (non HMO) | Admitting: Cardiology

## 2021-05-29 ENCOUNTER — Other Ambulatory Visit: Payer: Self-pay

## 2021-05-29 ENCOUNTER — Encounter: Payer: Self-pay | Admitting: Cardiology

## 2021-05-29 VITALS — BP 146/80 | HR 90 | Ht 70.0 in | Wt 242.0 lb

## 2021-05-29 DIAGNOSIS — R5383 Other fatigue: Secondary | ICD-10-CM | POA: Diagnosis not present

## 2021-05-29 DIAGNOSIS — E669 Obesity, unspecified: Secondary | ICD-10-CM

## 2021-05-29 DIAGNOSIS — Z1329 Encounter for screening for other suspected endocrine disorder: Secondary | ICD-10-CM | POA: Diagnosis not present

## 2021-05-29 DIAGNOSIS — I10 Essential (primary) hypertension: Secondary | ICD-10-CM

## 2021-05-29 NOTE — Progress Notes (Signed)
Cardiology Office Note:    Date:  05/29/2021   ID:  Paul Bowen, DOB [birth year], MRN [id number]  PCP:  Patient, No Pcp Per (Inactive)  Cardiologist:  Jenean Lindau, MD   Referring MD: No ref. provider found    ASSESSMENT:    1. Thyroid disorder screen   2. Essential hypertension   3. Other fatigue   4. Obesity (BMI 30.0-34.9)    PLAN:    In order of problems listed above:  Primary prevention stressed with the patient.  Importance of compliance with diet medication stressed any vocalized understanding.  He was advised to walk at least half an hour a day 5 days a week and he promises to do so. Obesity: Weight reduction stressed diet emphasized.  He is going to plan to do better.  Risks of obesity emphasized. Essential hypertension: Blood pressure stable and diet was emphasized.  His blood pressures at home are fine.  Lifestyle modification encouraged.  Salt intake issues were discussed. Patient will be seen in follow-up appointment in 9 months or earlier if the patient has any concerns    Medication Adjustments/Labs and Tests Ordered: Current medicines are reviewed at length with the patient today.  Concerns regarding medicines are outlined above.  Orders Placed This Encounter  Procedures   Basic metabolic panel   CBC with Differential/Platelet   Hepatic function panel   Lipid panel   TSH   VITAMIN D 25 Hydroxy (Vit-D Deficiency, Fractures)    No orders of the defined types were placed in this encounter.    No chief complaint on file.    History of Present Illness:    Paul Bowen is a 35 y.o. male.  Patient has past medical history of essential hypertension and obesity.  He tells me that he overall he is an active gentleman leads a sedentary lifestyle.  He denies any chest pain orthopnea or PND.  He is here for routine follow-up.  At the time of my evaluation, the patient is alert awake oriented and in no distress.  Past Medical History:  Diagnosis Date    Abdominal bruit 08/30/2019   Cardiac murmur 08/30/2019   Chest tightness 08/30/2019   Dermatitis of foot 10/10/2015   Dr. Altamese Cabal with [facility]: clobetasol cream and prednisone March 2017    Epididymal cyst 05/03/2013   2.1cm right testicle October 2014    Essential hypertension 08/30/2019   Fatigue 08/16/2015   Hematuria    Nephrolithiasis 04/17/2015   Obesity (BMI 30.0-34.9) 09/26/2020   Skin problem 06/11/2017   Following with dermatology, Louanne Belton, PA-C at [facility] dermatology, 05/26/2017: Family history of malignant melanoma, evidence of chronic actinic damage, solar: 2 to knees, melanocytic nevi. Seborrheic dermatitis of scalp and beard, prescribed ketoconazole 2% shampoo, fluocinonide 0.05% solution. Follow up annually    Past Surgical History:  Procedure Laterality Date   TONSILLECTOMY     WISDOM TOOTH EXTRACTION      Current Medications: Current Meds  Medication Sig   lisinopril (ZESTRIL) 10 MG tablet TAKE 1 TABLET(10 MG) BY MOUTH DAILY     Allergies:   Amlodipine   Social History   Socioeconomic History   Marital status: Married    Spouse name: Not on file   Number of children: Not on file   Years of education: Not on file   Highest education level: Not on file  Occupational History   Not on file  Tobacco Use   Smoking status: Former    Types: Cigarettes  Quit date: 05/01/2007    Years since quitting: 14.0   Smokeless tobacco: Never  Vaping Use   Vaping Use: Never used  Substance and Sexual Activity   Alcohol use: Not Currently   Drug use: No   Sexual activity: Yes  Other Topics Concern   Not on file  Social History Narrative   Not on file   Social Determinants of Health   Financial Resource Strain: Not on file  Food Insecurity: Not on file  Transportation Needs: Not on file  Physical Activity: Not on file  Stress: Not on file  Social Connections: Not on file     Family History: The patient's family history includes Hypertension in his  mother.  ROS:   Please see the history of present illness.    All other systems reviewed and are negative.  EKGs/Labs/Other Studies Reviewed:    The following studies were reviewed today: I discussed my findings with the patient at length.   Recent Labs: No results found for requested labs within last 8760 hours.  Recent Lipid Panel    Component Value Date/Time   CHOL 146 04/05/2020 0842   TRIG 78 04/05/2020 0842   HDL 41 04/05/2020 0842   CHOLHDL 3.6 04/05/2020 0842   CHOLHDL 4.3 04/17/2015 1041   VLDL 27 04/17/2015 1041   LDLCALC 90 04/05/2020 0842    Physical Exam:    VS:  BP (!) 146/80   Pulse 90   Ht 5\' 10"  (1.778 m)   Wt 242 lb (109.8 kg)   SpO2 99%   BMI 34.72 kg/m     Wt Readings from Last 3 Encounters:  05/29/21 242 lb (109.8 kg)  09/26/20 241 lb 0.6 oz (109.3 kg)  04/05/20 245 lb 1.3 oz (111.2 kg)     GEN: Patient is in no acute distress HEENT: Normal NECK: No JVD; No carotid bruits LYMPHATICS: No lymphadenopathy CARDIAC: Hear sounds regular, 2/6 systolic murmur at the apex. RESPIRATORY:  Clear to auscultation without rales, wheezing or rhonchi  ABDOMEN: Soft, non-tender, non-distended MUSCULOSKELETAL:  No edema; No deformity  SKIN: Warm and dry NEUROLOGIC:  Alert and oriented x 3 PSYCHIATRIC:  Normal affect   Signed, 04/07/20, MD  05/29/2021 5:06 PM    [facility]

## 2021-05-29 NOTE — Patient Instructions (Signed)
Medication Instructions:  Your physician recommends that you continue on your current medications as directed. Please refer to the Current Medication list given to you today.  *If you need a refill on your cardiac medications before your next appointment, please call your pharmacy*   Lab Work: Your physician recommends that you have labs done in the office today. Your test included  basic metabolic panel, complete blood count, TSH, liver function and lipids.  If you have labs (blood work) drawn today and your tests are completely normal, you will receive your results only by: MyChart Message (if you have MyChart) OR A paper copy in the mail If you have any lab test that is abnormal or we need to change your treatment, we will call you to review the results.   Testing/Procedures: Your physician recommends that you return for lab work in: next few days You need to have labs done when you are fasting.The labs you are going to have done are BMET, CBC, TSH, vitamin D, LFT and Lipids.    Follow-Up: At [facility], you and your health needs are our priority.  As part of our continuing mission to provide you with exceptional heart care, we have created designated Provider Care Teams.  These Care Teams include your primary Cardiologist (physician) and Advanced Practice Providers (APPs -  Physician Assistants and Nurse Practitioners) who all work together to provide you with the care you need, when you need it.  We recommend signing up for the patient portal called "MyChart".  Sign up information is provided on this After Visit Summary.  MyChart is used to connect with patients for Virtual Visits (Telemedicine).  Patients are able to view lab/test results, encounter notes, upcoming appointments, etc.  Non-urgent messages can be sent to your provider as well.   To learn more about what you can do with MyChart, go to [email or web address].    Your next appointment:   6 month(s)  The format for  your next appointment:   In Person  Provider:   Belva Crome, MD   Other Instructions NA

## 2022-01-03 ENCOUNTER — Ambulatory Visit: Payer: Managed Care, Other (non HMO) | Admitting: Cardiology
